# Patient Record
Sex: Female | Born: 1955 | Race: White | Hispanic: No | Marital: Married | Smoking: Never smoker
Health system: Southern US, Community
[De-identification: ages and names within clinical notes are randomized; demographics above are authoritative.]

## PROBLEM LIST (undated history)

## (undated) DIAGNOSIS — N6310 Unspecified lump in the right breast, unspecified quadrant: Secondary | ICD-10-CM

## (undated) DIAGNOSIS — R5383 Other fatigue: Secondary | ICD-10-CM

## (undated) DIAGNOSIS — L0591 Pilonidal cyst without abscess: Secondary | ICD-10-CM

## (undated) DIAGNOSIS — E7800 Pure hypercholesterolemia, unspecified: Secondary | ICD-10-CM

## (undated) DIAGNOSIS — N879 Dysplasia of cervix uteri, unspecified: Secondary | ICD-10-CM

## (undated) DIAGNOSIS — M85859 Other specified disorders of bone density and structure, unspecified thigh: Secondary | ICD-10-CM

## (undated) DIAGNOSIS — E559 Vitamin D deficiency, unspecified: Secondary | ICD-10-CM

## (undated) DIAGNOSIS — H11151 Pinguecula, right eye: Secondary | ICD-10-CM

## (undated) DIAGNOSIS — Z87442 Personal history of urinary calculi: Secondary | ICD-10-CM

## (undated) DIAGNOSIS — S62109A Fracture of unspecified carpal bone, unspecified wrist, initial encounter for closed fracture: Secondary | ICD-10-CM

## (undated) DIAGNOSIS — M1990 Unspecified osteoarthritis, unspecified site: Secondary | ICD-10-CM

## (undated) DIAGNOSIS — N200 Calculus of kidney: Secondary | ICD-10-CM

## (undated) HISTORY — PX: BREAST SURGERY: SHX581

## (undated) HISTORY — DX: Dysplasia of cervix uteri, unspecified: N87.9

## (undated) HISTORY — PX: FRACTURE SURGERY: SHX138

## (undated) HISTORY — DX: Pinguecula, right eye: H11.151

## (undated) HISTORY — DX: Calculus of kidney: N20.0

## (undated) HISTORY — PX: COSMETIC SURGERY: SHX468

## (undated) HISTORY — DX: Unspecified osteoarthritis, unspecified site: M19.90

## (undated) HISTORY — PX: EYE SURGERY: SHX253

---
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
## 1977-05-24 HISTORY — PX: OTHER SURGICAL HISTORY: SHX169

## 1978-05-24 DIAGNOSIS — N879 Dysplasia of cervix uteri, unspecified: Secondary | ICD-10-CM

## 1978-05-24 HISTORY — DX: Dysplasia of cervix uteri, unspecified: N87.9

## 1985-05-24 DIAGNOSIS — N200 Calculus of kidney: Secondary | ICD-10-CM

## 1985-05-24 HISTORY — DX: Calculus of kidney: N20.0

## 1988-05-24 HISTORY — PX: TUBAL LIGATION: SHX77

## 2004-05-24 HISTORY — PX: FOOT SURGERY: SHX648

## 2004-09-15 ENCOUNTER — Ambulatory Visit: Payer: Self-pay | Admitting: Unknown Physician Specialty

## 2005-12-23 ENCOUNTER — Ambulatory Visit: Payer: Self-pay | Admitting: Unknown Physician Specialty

## 2006-01-13 ENCOUNTER — Ambulatory Visit: Payer: Self-pay | Admitting: Gastroenterology

## 2006-12-23 LAB — HM COLONOSCOPY: HM Colonoscopy: NORMAL

## 2006-12-29 ENCOUNTER — Ambulatory Visit: Payer: Self-pay | Admitting: Unknown Physician Specialty

## 2008-01-01 ENCOUNTER — Ambulatory Visit: Payer: Self-pay | Admitting: Unknown Physician Specialty

## 2009-02-03 ENCOUNTER — Ambulatory Visit: Payer: Self-pay | Admitting: Unknown Physician Specialty

## 2010-05-24 HISTORY — PX: BREAST SURGERY: SHX581

## 2010-05-24 HISTORY — PX: BREAST BIOPSY: SHX20

## 2010-06-01 ENCOUNTER — Ambulatory Visit: Payer: Self-pay | Admitting: Unknown Physician Specialty

## 2010-06-19 ENCOUNTER — Ambulatory Visit: Payer: Self-pay | Admitting: Unknown Physician Specialty

## 2010-06-29 ENCOUNTER — Ambulatory Visit: Payer: Self-pay | Admitting: Unknown Physician Specialty

## 2010-07-07 ENCOUNTER — Ambulatory Visit: Payer: Self-pay | Admitting: Surgery

## 2010-11-26 LAB — HM MAMMOGRAPHY: HM Mammogram: NORMAL

## 2010-12-16 ENCOUNTER — Ambulatory Visit: Payer: Self-pay | Admitting: Surgery

## 2011-07-23 ENCOUNTER — Ambulatory Visit (INDEPENDENT_AMBULATORY_CARE_PROVIDER_SITE_OTHER): Payer: BC Managed Care – PPO | Admitting: Internal Medicine

## 2011-07-23 ENCOUNTER — Encounter: Payer: Self-pay | Admitting: Internal Medicine

## 2011-07-23 VITALS — BP 96/56 | HR 84 | Temp 98.0°F | Resp 16 | Ht 60.75 in | Wt 102.8 lb

## 2011-07-23 DIAGNOSIS — H11151 Pinguecula, right eye: Secondary | ICD-10-CM | POA: Insufficient documentation

## 2011-07-23 DIAGNOSIS — B351 Tinea unguium: Secondary | ICD-10-CM

## 2011-07-23 DIAGNOSIS — N200 Calculus of kidney: Secondary | ICD-10-CM | POA: Insufficient documentation

## 2011-07-23 NOTE — Patient Instructions (Addendum)
Returni June for yor annual physical

## 2011-07-23 NOTE — Progress Notes (Signed)
  Subjective:    Patient ID: Megan Mcintosh, female    DOB: 10-13-1955, 56 y.o.   MRN: [id number]  HPI  Ms. Bordonaro is a very healthy perimenopausal woman who is transferring care from Dr. Francia Greaves  Cc thickened discolored toe nails. Wears tennis shoes a lot, very athletic,  No pedicures except in summer to cover her discolored nails.      Review of Systems  Constitutional: Negative for fever, chills, appetite change, fatigue and unexpected weight change.  HENT: Negative for ear pain, congestion, sore throat, trouble swallowing, neck pain, voice change and sinus pressure.   Eyes: Negative for visual disturbance.  Respiratory: Negative for cough, shortness of breath, wheezing and stridor.   Cardiovascular: Negative for chest pain, palpitations and leg swelling.  Gastrointestinal: Negative for nausea, vomiting, abdominal pain, diarrhea, constipation, blood in stool, abdominal distention and anal bleeding.  Genitourinary: Negative for dysuria, flank pain and pelvic pain.  Musculoskeletal: Negative for myalgias, arthralgias and gait problem.  Skin: Negative for color change and rash.  Neurological: Positive for facial asymmetry. Negative for dizziness and headaches.  Hematological: Negative for adenopathy. Does not bruise/bleed easily.  Psychiatric/Behavioral: Negative for suicidal ideas, sleep disturbance and dysphoric mood. The patient is not nervous/anxious.        Objective:   Physical Exam  Constitutional: She is oriented to person, place, and time. She appears well-developed and well-nourished.  HENT:  Mouth/Throat: Oropharynx is clear and moist.  Eyes: EOM are normal. Pupils are equal, round, and reactive to light. No scleral icterus.  Neck: Normal range of motion. Neck supple. No JVD present. No thyromegaly present.  Cardiovascular: Normal rate, regular rhythm, normal heart sounds and intact distal pulses.   Pulmonary/Chest: Effort normal and breath sounds normal.  Abdominal: Soft.  Bowel sounds are normal. She exhibits no mass. There is no tenderness.  Musculoskeletal: Normal range of motion. She exhibits no edema.  Lymphadenopathy:    She has no cervical adenopathy.  Neurological: She is alert and oriented to person, place, and time.  Skin: Skin is warm and dry.     Psychiatric: She has a normal mood and affect.          Assessment & Plan:

## 2011-07-25 ENCOUNTER — Encounter: Payer: Self-pay | Admitting: Internal Medicine

## 2011-07-25 DIAGNOSIS — B351 Tinea unguium: Secondary | ICD-10-CM | POA: Insufficient documentation

## 2011-07-25 NOTE — Assessment & Plan Note (Signed)
Recommended nonpharmacologic methods of treating.

## 2011-11-24 ENCOUNTER — Encounter: Payer: Self-pay | Admitting: Internal Medicine

## 2011-11-24 ENCOUNTER — Ambulatory Visit (INDEPENDENT_AMBULATORY_CARE_PROVIDER_SITE_OTHER): Payer: BC Managed Care – PPO | Admitting: Internal Medicine

## 2011-11-24 VITALS — BP 104/62 | HR 60 | Temp 97.8°F | Resp 16 | Ht 60.75 in | Wt 93.5 lb

## 2011-11-24 DIAGNOSIS — R634 Abnormal weight loss: Secondary | ICD-10-CM

## 2011-11-24 DIAGNOSIS — Z13220 Encounter for screening for lipoid disorders: Secondary | ICD-10-CM

## 2011-11-24 DIAGNOSIS — N879 Dysplasia of cervix uteri, unspecified: Secondary | ICD-10-CM

## 2011-11-24 DIAGNOSIS — D249 Benign neoplasm of unspecified breast: Secondary | ICD-10-CM

## 2011-11-24 DIAGNOSIS — Z0000 Encounter for general adult medical examination without abnormal findings: Secondary | ICD-10-CM

## 2011-11-24 DIAGNOSIS — J069 Acute upper respiratory infection, unspecified: Secondary | ICD-10-CM

## 2011-11-24 DIAGNOSIS — Z1239 Encounter for other screening for malignant neoplasm of breast: Secondary | ICD-10-CM

## 2011-11-24 NOTE — Patient Instructions (Addendum)
You have a viral  Syndrome .  The post nasal drip is causing your sore throat.  Lavage your sinuses twice daily with Simply saline nasal spray.  Use benadryl 25 mg every 8 hours and Sudafed every 12 hours to manage the drainage and congestion.  Gargle with salt water if needed for raspy throat.    if you develop T > 100.4,  Green nasal discharge,  Or facial pain,  I will call you in a second round of antibiotic.

## 2011-11-24 NOTE — Progress Notes (Addendum)
Patient ID: Megan Mcintosh, female   DOB: 11-29-1955, 56 y.o.   MRN: [id number]  Subjective:     Megan Mcintosh is a 56 y.o. female and is here for a comprehensive physical exam. The patient reports Symptoms of URI lasting one week.  Started with sore throat one week ago, which hhas resolved, but she continues to have nasal congestion.  She took a course of azithromycin that a family fried called in for her while she was at the beach and she also took sudafed 12 hour and Afrin nasal spray but continues to have congestion and rhinitis..  Coughing a little but not a major problem.  No fevers, facial pain.  Some ear pressure which has resolved.    History   Social History  . Marital Status: Married    Spouse Name: N/A    Number of Children: N/A  . Years of Education: N/A   Occupational History  . Not on file.   Social History Main Topics  . Smoking status: Never Smoker   . Smokeless tobacco: Never Used  . Alcohol Use: Yes  . Drug Use: No  . Sexually Active: Not on file   Other Topics Concern  . Not on file   Social History Narrative  . No narrative on file   Health Maintenance  Topic Date Due  . Tetanus/tdap  01/07/1975  . Mammogram  01/06/2006  . Influenza Vaccine  02/22/2012  . Pap Smear  10/22/2013  . Colonoscopy  12/22/2016    The following portions of the patient's history were reviewed and updated as appropriate: allergies, current medications, past family history, past medical history, past social history, past surgical history and problem list.  Review of Systems A comprehensive review of systems was negative except for: Ears, nose, mouth, throat, and face: positive for nasal congestion   Objective:    BP 104/62  Pulse 60  Temp 97.8 F (36.6 C) (Oral)  Resp 16  Ht 5' 0.75" (1.543 m)  Wt 93 lb 8 oz (42.411 kg)  BMI 17.81 kg/m2  SpO2 99% General appearance: alert, cooperative and appears stated age Head: Normocephalic, without obvious abnormality, atraumatic Eyes:  conjunctivae/corneas clear. PERRL, EOM's intact. Fundi benign. Ears: abnormal TM right ear - diminished mobility, amber in color and serous middle ear fluid Nose: Nares normal. Septum midline. Mucosa normal. No drainage or sinus tenderness. Throat: lips, mucosa, and tongue normal; teeth and gums normal Neck: no adenopathy, no carotid bruit, no JVD, supple, symmetrical, trachea midline and thyroid not enlarged, symmetric, no tenderness/mass/nodules Back: symmetric, no curvature. ROM normal. No CVA tenderness. Lungs: clear to auscultation bilaterally Breasts: normal appearance, no masses or tenderness Heart: regular rate and rhythm, S1, S2 normal, no murmur, click, rub or gallop Abdomen: soft, non-tender; bowel sounds normal; no masses,  no organomegaly Extremities: extremities normal, atraumatic, no cyanosis or edema Skin: Skin color, texture, turgor normal. No rashes or lesions    Assessment:    Healthy female exam.  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  She will resume annual sceeening July 2013.  URI (upper respiratory infection) This URI is most likely viral given the failure of mild HEENT  symptoms to resolve after a z pack.  I have explained that in viral URIS, an antibiotic will not help the symptoms and will increase the risk of developing diarrhea.,  Continue oral and nasal decongestants,  Ibuprofen 400 mg and tylenol 650 mq 8 hrs for aches and pains,  And  will addtessalon 100 mg every 8 hours prn cough  And seconda round of abx only if symptoms worsen to include fevers, facial pain, purulent sputum./drainage.    Cervical dysplasia Her annual PAPS are done by [facility] due to history of dysplasia   Updated Medication List Outpatient Encounter Prescriptions as of 11/24/2011  Medication Sig Dispense Refill  . Calcium Carbonate-Vitamin D (CALCIUM + D) 600-200 MG-UNIT TABS Take by mouth.        Plan:     See After Visit Summary for Counseling  Recommendations

## 2011-11-26 DIAGNOSIS — D249 Benign neoplasm of unspecified breast: Secondary | ICD-10-CM | POA: Insufficient documentation

## 2011-11-26 DIAGNOSIS — J069 Acute upper respiratory infection, unspecified: Secondary | ICD-10-CM | POA: Insufficient documentation

## 2011-11-26 NOTE — Assessment & Plan Note (Signed)
S/p biopsy of right breast spiculated nodule Feb 2012.  She will resume annual sceeening July 2013.

## 2011-11-26 NOTE — Assessment & Plan Note (Signed)
Her annual PAPS are done by [facility] due to history of dysplasia

## 2011-11-26 NOTE — Assessment & Plan Note (Signed)
This URI is most likely viral given the failure of mild HEENT  symptoms to resolve after a z pack.  I have explained that in viral URIS, an antibiotic will not help the symptoms and will increase the risk of developing diarrhea.,  Continue oral and nasal decongestants,  Ibuprofen 400 mg and tylenol 650 mq 8 hrs for aches and pains,  And will addtessalon 100 mg every 8 hours prn cough  And seconda round of abx only if symptoms worsen to include fevers, facial pain, purulent sputum./drainage.  

## 2011-12-15 ENCOUNTER — Other Ambulatory Visit (INDEPENDENT_AMBULATORY_CARE_PROVIDER_SITE_OTHER): Payer: BC Managed Care – PPO | Admitting: *Deleted

## 2011-12-15 ENCOUNTER — Other Ambulatory Visit: Payer: BC Managed Care – PPO

## 2011-12-15 DIAGNOSIS — R634 Abnormal weight loss: Secondary | ICD-10-CM

## 2011-12-15 DIAGNOSIS — Z13220 Encounter for screening for lipoid disorders: Secondary | ICD-10-CM

## 2011-12-15 LAB — LIPID PANEL
HDL: 118.9 mg/dL (ref >39.00)
VLDL: 10.6 mg/dL (ref 0.0–40.0)

## 2011-12-15 LAB — CBC WITH DIFFERENTIAL/PLATELET
Basophils Relative: 0.4 % (ref 0.0–3.0)
Eosinophils Relative: 0.4 % (ref 0.0–5.0)
Hemoglobin: 13.6 g/dL (ref 12.0–15.0)
Lymphocytes Relative: 21.5 % (ref 12.0–46.0)
MCHC: 33 g/dL (ref 30.0–36.0)
Neutro Abs: 3 10*3/uL (ref 1.4–7.7)
Neutrophils Relative %: 68.6 % (ref 43.0–77.0)
RBC: 4.55 Mil/uL (ref 3.87–5.11)
WBC: 4.4 10*3/uL — ABNORMAL LOW (ref 4.5–10.5)

## 2011-12-16 LAB — COMPLETE METABOLIC PANEL WITH GFR
ALT: 11 U/L (ref 0–35)
AST: 22 U/L (ref 0–37)
Alkaline Phosphatase: 52 U/L (ref 39–117)
CO2: 24 mEq/L (ref 19–32)
GFR, Est African American: >89 mL/min
Sodium: 137 mEq/L (ref 135–145)
Total Bilirubin: 0.9 mg/dL (ref 0.3–1.2)
Total Protein: 6.3 g/dL (ref 6.0–8.3)

## 2011-12-28 ENCOUNTER — Ambulatory Visit: Payer: Self-pay | Admitting: Internal Medicine

## 2012-01-11 ENCOUNTER — Encounter: Payer: Self-pay | Admitting: Internal Medicine

## 2012-07-08 ENCOUNTER — Other Ambulatory Visit: Payer: Self-pay

## 2012-11-26 ENCOUNTER — Encounter: Payer: Self-pay | Admitting: Internal Medicine

## 2012-11-27 ENCOUNTER — Ambulatory Visit (INDEPENDENT_AMBULATORY_CARE_PROVIDER_SITE_OTHER): Payer: BC Managed Care – PPO | Admitting: Internal Medicine

## 2012-11-27 ENCOUNTER — Ambulatory Visit: Payer: Self-pay | Admitting: Internal Medicine

## 2012-11-27 ENCOUNTER — Telehealth: Payer: Self-pay | Admitting: Internal Medicine

## 2012-11-27 ENCOUNTER — Encounter: Payer: Self-pay | Admitting: Internal Medicine

## 2012-11-27 VITALS — BP 100/76 | HR 62 | Temp 98.1°F | Wt 96.0 lb

## 2012-11-27 DIAGNOSIS — R109 Unspecified abdominal pain: Secondary | ICD-10-CM | POA: Insufficient documentation

## 2012-11-27 LAB — CBC WITH DIFFERENTIAL/PLATELET
Eosinophils Relative: 0.9 % (ref 0.0–5.0)
Lymphocytes Relative: 21.8 % (ref 12.0–46.0)
MCV: 90.4 fl (ref 78.0–100.0)
Monocytes Absolute: 0.7 10*3/uL (ref 0.1–1.0)
Monocytes Relative: 13.9 % — ABNORMAL HIGH (ref 3.0–12.0)
Neutrophils Relative %: 63.1 % (ref 43.0–77.0)
Platelets: 331 10*3/uL (ref 150.0–400.0)
RBC: 4.69 Mil/uL (ref 3.87–5.11)
WBC: 4.8 10*3/uL (ref 4.5–10.5)

## 2012-11-27 LAB — POCT URINALYSIS DIPSTICK
Glucose, UA: NEGATIVE
Leukocytes, UA: NEGATIVE
Urobilinogen, UA: 0.2

## 2012-11-27 LAB — COMPREHENSIVE METABOLIC PANEL
Albumin: 4 g/dL (ref 3.5–5.2)
BUN: 15 mg/dL (ref 6–23)
CO2: 25 mEq/L (ref 19–32)
Calcium: 9.1 mg/dL (ref 8.4–10.5)
Chloride: 105 mEq/L (ref 96–112)
GFR: 94.83 mL/min (ref >60.00)
Glucose, Bld: 94 mg/dL (ref 70–99)
Potassium: 4.2 mEq/L (ref 3.5–5.1)
Sodium: 138 mEq/L (ref 135–145)
Total Protein: 6.8 g/dL (ref 6.0–8.3)

## 2012-11-27 NOTE — Progress Notes (Signed)
  Subjective:    Patient ID: Megan Mcintosh, female    DOB: 01/19/1956, 57 y.o.   MRN: [id number]  HPI 57YO female presents for acute visit with 4 days h/o mid abdominal pain. Described as sharp, intermittent. Occurs several times during the day, without clear trigger. Improves without any intervention after several hours. Pt able to eat without change in symptoms. No nausea, vomiting, diarrhea. Having regular BM. Had some chills and fever on Friday and Saturday. Not taking any new medications, supplements. No known sick contacts. Had c-section in the past x2. No other abdominal surgery.  Outpatient Encounter Prescriptions as of 11/27/2012  Medication Sig Dispense Refill  . Calcium Carbonate-Vitamin D (CALCIUM + D) 600-200 MG-UNIT TABS Take by mouth.       No facility-administered encounter medications on file as of 11/27/2012.   BP 100/76  Pulse 62  Temp(Src) 98.1 F (36.7 C) (Oral)  Wt 96 lb (43.545 kg)  BMI 18.29 kg/m2  SpO2 98%  Review of Systems  Constitutional: Positive for fever. Negative for chills, appetite change, fatigue and unexpected weight change.  HENT: Negative for ear pain, congestion, sore throat, trouble swallowing, neck pain, voice change and sinus pressure.   Eyes: Negative for visual disturbance.  Respiratory: Negative for cough, shortness of breath, wheezing and stridor.   Cardiovascular: Negative for chest pain, palpitations and leg swelling.  Gastrointestinal: Positive for abdominal pain and abdominal distention. Negative for nausea, vomiting, diarrhea, constipation, blood in stool and anal bleeding.  Genitourinary: Negative for dysuria and flank pain.  Musculoskeletal: Negative for myalgias, arthralgias and gait problem.  Skin: Negative for color change and rash.  Neurological: Negative for dizziness and headaches.  Hematological: Negative for adenopathy. Does not bruise/bleed easily.  Psychiatric/Behavioral: Negative for suicidal ideas, sleep disturbance and  dysphoric mood. The patient is not nervous/anxious.        Objective:   Physical Exam  Constitutional: She is oriented to person, place, and time. She appears well-developed and well-nourished. No distress.  HENT:  Head: Normocephalic and atraumatic.  Right Ear: External ear normal.  Left Ear: External ear normal.  Nose: Nose normal.  Mouth/Throat: Oropharynx is clear and moist. No oropharyngeal exudate.  Eyes: Conjunctivae are normal. Pupils are equal, round, and reactive to light. Right eye exhibits no discharge. Left eye exhibits no discharge. No scleral icterus.  Neck: Normal range of motion. Neck supple. No tracheal deviation present. No thyromegaly present.  Cardiovascular: Normal rate, regular rhythm, normal heart sounds and intact distal pulses.  Exam reveals no gallop and no friction rub.   No murmur heard. Pulmonary/Chest: Effort normal and breath sounds normal. No accessory muscle usage. Not tachypneic. No respiratory distress. She has no decreased breath sounds. She has no wheezes. She has no rhonchi. She has no rales. She exhibits no tenderness.  Abdominal: Soft. Bowel sounds are normal. She exhibits distension. She exhibits no mass. There is tenderness. There is guarding (mild diffuse).  Musculoskeletal: Normal range of motion. She exhibits no edema and no tenderness.  Lymphadenopathy:    She has no cervical adenopathy.  Neurological: She is alert and oriented to person, place, and time. No cranial nerve deficit. She exhibits normal muscle tone. Coordination normal.  Skin: Skin is warm and dry. No rash noted. She is not diaphoretic. No erythema. No pallor.  Psychiatric: She has a normal mood and affect. Her behavior is normal. Judgment and thought content normal.          Assessment & Plan:

## 2012-11-27 NOTE — Addendum Note (Signed)
Addended by: Montine Circle D on: 11/27/2012 11:19 AM   Modules accepted: Orders

## 2012-11-27 NOTE — Assessment & Plan Note (Signed)
Periumbilical abdominal pain, fever. No nausea or vomiting or diarrhea to suggest viral illness. Mild distension and guarding noted on exam. Will set up CT abdomen and pelvis to evaluate acute appendicitis. Will send UA, CBC, CMP.

## 2012-11-27 NOTE — Telephone Encounter (Signed)
° °  Patient Information:  Caller Name: Susie  Phone: [phone]  Patient: Megan Mcintosh, Megan Mcintosh  Gender: Female  DOB: 12-02-1955  Age: 57 Years  PCP: Duncan Dull (Adults only)  Office Follow Up:  Does the office need to follow up with this patient?: No  Instructions For The Office: N/A   Symptoms  Reason For Call & Symptoms: Pt started with abdominal pain on Friday 11/24/12. Pt had fever over the w/e 100 (orally)  Reviewed Health History In EMR: Yes  Reviewed Medications In EMR: Yes  Reviewed Allergies In EMR: Yes  Reviewed Surgeries / Procedures: Yes  Date of Onset of Symptoms: 11/24/2012  Guideline(s) Used:  Abdominal Pain - Female  Disposition Per Guideline:   See Today in Office  Reason For Disposition Reached:   Moderate or mild pain that comes and goes (cramps) lasts > 24 hours  Advice Given:  N/A  Patient Will Follow Care Advice:  YES  Appointment Scheduled:  11/27/2012 09:30:00 Appointment Scheduled Provider:  Ronna Polio (Adults only)

## 2012-11-28 ENCOUNTER — Encounter: Payer: Self-pay | Admitting: Internal Medicine

## 2012-11-28 ENCOUNTER — Telehealth: Payer: Self-pay | Admitting: Internal Medicine

## 2012-11-28 NOTE — Telephone Encounter (Signed)
Follow up.

## 2012-11-29 LAB — URINE CULTURE: Colony Count: 10000

## 2012-12-06 ENCOUNTER — Encounter: Payer: Self-pay | Admitting: Internal Medicine

## 2012-12-12 ENCOUNTER — Other Ambulatory Visit (HOSPITAL_COMMUNITY)
Admission: RE | Admit: 2012-12-12 | Discharge: 2012-12-12 | Disposition: A | Discharge status: Home / Self Care | Payer: BC Managed Care – PPO | Source: Ambulatory Visit | Attending: Internal Medicine | Admitting: Internal Medicine

## 2012-12-12 ENCOUNTER — Encounter: Payer: Self-pay | Admitting: Internal Medicine

## 2012-12-12 ENCOUNTER — Ambulatory Visit (INDEPENDENT_AMBULATORY_CARE_PROVIDER_SITE_OTHER): Payer: BC Managed Care – PPO | Admitting: Internal Medicine

## 2012-12-12 VITALS — BP 100/68 | HR 59 | Temp 97.8°F | Resp 14 | Ht 61.0 in | Wt 95.8 lb

## 2012-12-12 DIAGNOSIS — D249 Benign neoplasm of unspecified breast: Secondary | ICD-10-CM

## 2012-12-12 DIAGNOSIS — Z1151 Encounter for screening for human papillomavirus (HPV): Secondary | ICD-10-CM | POA: Insufficient documentation

## 2012-12-12 DIAGNOSIS — Z0000 Encounter for general adult medical examination without abnormal findings: Secondary | ICD-10-CM | POA: Insufficient documentation

## 2012-12-12 DIAGNOSIS — Z1239 Encounter for other screening for malignant neoplasm of breast: Secondary | ICD-10-CM

## 2012-12-12 DIAGNOSIS — D72819 Decreased white blood cell count, unspecified: Secondary | ICD-10-CM

## 2012-12-12 DIAGNOSIS — Z01419 Encounter for gynecological examination (general) (routine) without abnormal findings: Secondary | ICD-10-CM | POA: Insufficient documentation

## 2012-12-12 DIAGNOSIS — Z23 Encounter for immunization: Secondary | ICD-10-CM

## 2012-12-12 DIAGNOSIS — N879 Dysplasia of cervix uteri, unspecified: Secondary | ICD-10-CM

## 2012-12-12 NOTE — Assessment & Plan Note (Addendum)
PAP smear and HPV screen was  done today due to history of cervical dysplasia.

## 2012-12-12 NOTE — Patient Instructions (Addendum)
You had your annual  wellness exam today, including a PAP smear   We will schedule your mammogram soon.  You received the TDaP vaccine today  We will repeat your DEXA scan 5 years from your prior bone density test

## 2012-12-14 ENCOUNTER — Encounter: Payer: Self-pay | Admitting: Internal Medicine

## 2012-12-14 NOTE — Progress Notes (Signed)
Patient ID: Megan Mcintosh, female   DOB: 10-Aug-1955, 57 y.o.   MRN: [id number]  Subjective:     Megan Mcintosh is a 57 y.o. female and is here for a comprehensive physical exam. The patient reports no problems.  History   Social History  . Marital Status: Married    Spouse Name: N/A    Number of Children: N/A  . Years of Education: N/A   Occupational History  . Not on file.   Social History Main Topics  . Smoking status: Never Smoker   . Smokeless tobacco: Never Used  . Alcohol Use: Yes  . Drug Use: No  . Sexually Active: Not on file   Other Topics Concern  . Not on file   Social History Narrative  . No narrative on file   Health Maintenance  Topic Date Due  . Influenza Vaccine  01/22/2013  . Pap Smear  10/22/2013  . Mammogram  12/27/2013  . Colonoscopy  12/22/2016  . Tetanus/tdap  12/13/2022    The following portions of the patient's history were reviewed and updated as appropriate: allergies, current medications, past family history, past medical history, past social history, past surgical history and problem list.  Review of Systems A comprehensive review of systems was negative.   Objective:   BP 100/68  Pulse 59  Temp(Src) 97.8 F (36.6 C) (Oral)  Resp 14  Ht 5\' 1"  (1.549 m)  Wt 95 lb 12 oz (43.432 kg)  BMI 18.1 kg/m2  SpO2 99%  General Appearance:    Alert, cooperative, no distress, appears stated age  Head:    Normocephalic, without obvious abnormality, atraumatic  Eyes:    PERRL, conjunctiva/corneas clear, EOM's intact, fundi    benign, both eyes  Ears:    Normal TM's and external ear canals, both ears  Nose:   Nares normal, septum midline, mucosa normal, no drainage    or sinus tenderness  Throat:   Lips, mucosa, and tongue normal; teeth and gums normal  Neck:   Supple, symmetrical, trachea midline, no adenopathy;    thyroid:  no enlargement/tenderness/nodules; no carotid   bruit or JVD  Back:     Symmetric, no curvature, ROM normal, no CVA  tenderness  Lungs:     Clear to auscultation bilaterally, respirations unlabored  Chest Nydam:    No tenderness or deformity   Heart:    Regular rate and rhythm, S1 and S2 normal, no murmur, rub   or gallop  Breast Exam:    No tenderness, masses, or nipple abnormality  Abdomen:     Soft, non-tender, bowel sounds active all four quadrants,    no masses, no organomegaly  Genitalia:    Pelvic: cervix normal in appearance, external genitalia normal, no adnexal masses or tenderness, no cervical motion tenderness, rectovaginal septum normal, uterus normal size, shape, and consistency and vagina normal without discharge  Extremities:   Extremities normal, atraumatic, no cyanosis or edema  Pulses:   2+ and symmetric all extremities  Skin:   Skin color, texture, turgor normal, no rashes or lesions  Lymph nodes:   Cervical, supraclavicular, and axillary nodes normal  Neurologic:   CNII-XII intact, normal strength, sensation and reflexes    throughout     Assessment:   Cervical dysplasia PAP smear and HPV screen was  done today due to history of cervical dysplasia.  Fibroadenoma of breast Annual mammograms are up-to-date.   Updated Medication List Outpatient Encounter Prescriptions as of 12/12/2012  Medication Sig Dispense Refill  .  Calcium Carbonate-Vitamin D (CALCIUM + D) 600-200 MG-UNIT TABS Take by mouth.       No facility-administered encounter medications on file as of 12/12/2012.

## 2012-12-14 NOTE — Assessment & Plan Note (Signed)
Annual mammograms are up-to-date.

## 2012-12-19 ENCOUNTER — Encounter: Payer: Self-pay | Admitting: Internal Medicine

## 2012-12-19 LAB — HM PAP SMEAR: HM Pap smear: NORMAL

## 2012-12-22 ENCOUNTER — Encounter: Payer: Self-pay | Admitting: *Deleted

## 2012-12-22 NOTE — Telephone Encounter (Signed)
Mailed unread message to patient.  

## 2013-01-04 ENCOUNTER — Ambulatory Visit: Payer: Self-pay | Admitting: Internal Medicine

## 2013-01-04 LAB — HM MAMMOGRAPHY: HM Mammogram: NORMAL

## 2013-01-17 ENCOUNTER — Encounter: Payer: Self-pay | Admitting: Internal Medicine

## 2013-03-29 ENCOUNTER — Other Ambulatory Visit: Payer: Self-pay

## 2013-12-19 ENCOUNTER — Encounter: Payer: Self-pay | Admitting: Internal Medicine

## 2013-12-19 ENCOUNTER — Ambulatory Visit (INDEPENDENT_AMBULATORY_CARE_PROVIDER_SITE_OTHER): Payer: BC Managed Care – PPO | Admitting: Internal Medicine

## 2013-12-19 VITALS — BP 98/70 | HR 63 | Temp 97.6°F | Resp 16 | Ht 61.0 in | Wt 96.2 lb

## 2013-12-19 DIAGNOSIS — R5381 Other malaise: Secondary | ICD-10-CM

## 2013-12-19 DIAGNOSIS — Z0000 Encounter for general adult medical examination without abnormal findings: Secondary | ICD-10-CM

## 2013-12-19 DIAGNOSIS — R5383 Other fatigue: Secondary | ICD-10-CM

## 2013-12-19 DIAGNOSIS — R636 Underweight: Secondary | ICD-10-CM

## 2013-12-19 DIAGNOSIS — M949 Disorder of cartilage, unspecified: Secondary | ICD-10-CM

## 2013-12-19 DIAGNOSIS — M8580 Other specified disorders of bone density and structure, unspecified site: Secondary | ICD-10-CM

## 2013-12-19 DIAGNOSIS — N879 Dysplasia of cervix uteri, unspecified: Secondary | ICD-10-CM

## 2013-12-19 DIAGNOSIS — Z1239 Encounter for other screening for malignant neoplasm of breast: Secondary | ICD-10-CM

## 2013-12-19 DIAGNOSIS — M899 Disorder of bone, unspecified: Secondary | ICD-10-CM

## 2013-12-19 DIAGNOSIS — E785 Hyperlipidemia, unspecified: Secondary | ICD-10-CM

## 2013-12-19 NOTE — Progress Notes (Signed)
Patient ID: Megan Mcintosh, female   DOB: 12-Apr-1956, 58 y.o.   MRN: [id number]   Subjective:     Megan Mcintosh is a 58 y.o. female and is here for a comprehensive physical exam. The patient reports no problems.  History   Social History  . Marital Status: Married    Spouse Name: N/A    Number of Children: N/A  . Years of Education: N/A   Occupational History  . Not on file.   Social History Main Topics  . Smoking status: Never Smoker   . Smokeless tobacco: Never Used  . Alcohol Use: Yes  . Drug Use: No  . Sexual Activity: Not on file   Other Topics Concern  . Not on file   Social History Narrative  . No narrative on file   Health Maintenance  Topic Date Due  . Influenza Vaccine  12/22/2013  . Mammogram  01/05/2015  . Pap Smear  12/20/2015  . Colonoscopy  12/22/2016  . Tetanus/tdap  12/13/2022    The following portions of the patient's history were reviewed and updated as appropriate: allergies, current medications, past family history, past medical history, past social history, past surgical history and problem list.  Review of Systems A comprehensive review of systems was negative.   Objective:   BP 98/70  Pulse 63  Temp(Src) 97.6 F (36.4 C) (Oral)  Resp 16  Ht 5\' 1"  (1.549 m)  Wt 96 lb 4 oz (43.659 kg)  BMI 18.20 kg/m2  SpO2 96%  General appearance: alert, cooperative and appears stated age Head: Normocephalic, without obvious abnormality, atraumatic Eyes: conjunctivae/corneas clear. PERRL, EOM's intact. Fundi benign. Ears: normal TM's and external ear canals both ears Nose: Nares normal. Septum midline. Mucosa normal. No drainage or sinus tenderness. Throat: lips, mucosa, and tongue normal; teeth and gums normal Neck: no adenopathy, no carotid bruit, no JVD, supple, symmetrical, trachea midline and thyroid not enlarged, symmetric, no tenderness/mass/nodules Lungs: clear to auscultation bilaterally Breasts: normal appearance, no masses or  tenderness Heart: regular rate and rhythm, S1, S2 normal, no murmur, click, rub or gallop Abdomen: soft, non-tender; bowel sounds normal; no masses,  no organomegaly Extremities: extremities normal, atraumatic, no cyanosis or edema Pulses: 2+ and symmetric Skin: Skin color, texture, turgor normal. No rashes or lesions Neurologic: Alert and oriented X 3, normal strength and tone. Normal symmetric reflexes. Normal coordination and gait.   .    Assessment and Plan:    Cervical dysplasia PAP smear and HPV screens have been done annually x 3  since 2012 for history of cervical dysplasia.    Routine general medical examination at a health care facility Annual comprehensive exam was done including breast, excluding pelvic and PAP smear. All screenings have been addressed .    Updated Medication List Outpatient Encounter Prescriptions as of 12/19/2013  Medication Sig  . Calcium Carbonate-Vitamin D (CALCIUM + D) 600-200 MG-UNIT TABS Take by mouth.

## 2013-12-19 NOTE — Assessment & Plan Note (Addendum)
PAP smear and HPV screens have been done annually x 3  since 2012 for history of cervical dysplasia.

## 2013-12-19 NOTE — Patient Instructions (Addendum)
You had your annual  wellness exam today.  We will repeat your PAP smear in 2016,  We will schedule your mammogram in August using the 3d modaltiy   Please make an appt for fasting labs at your earliest convenience.   We will contact you with the bloodwork results imcluing Vit d level

## 2013-12-19 NOTE — Progress Notes (Signed)
Pre-visit discussion using our clinic review tool. No additional management support is needed unless otherwise documented below in the visit note.  

## 2013-12-22 NOTE — Assessment & Plan Note (Signed)
Annual comprehensive exam was done including breast, excluding pelvic and PAP smear. All screenings have been addressed .  

## 2013-12-26 ENCOUNTER — Telehealth: Payer: Self-pay | Admitting: *Deleted

## 2013-12-26 ENCOUNTER — Other Ambulatory Visit (INDEPENDENT_AMBULATORY_CARE_PROVIDER_SITE_OTHER): Payer: BC Managed Care – PPO

## 2013-12-26 DIAGNOSIS — E785 Hyperlipidemia, unspecified: Secondary | ICD-10-CM

## 2013-12-26 DIAGNOSIS — R5381 Other malaise: Secondary | ICD-10-CM

## 2013-12-26 DIAGNOSIS — M8580 Other specified disorders of bone density and structure, unspecified site: Secondary | ICD-10-CM

## 2013-12-26 DIAGNOSIS — R5383 Other fatigue: Secondary | ICD-10-CM

## 2013-12-26 DIAGNOSIS — M899 Disorder of bone, unspecified: Secondary | ICD-10-CM

## 2013-12-26 DIAGNOSIS — M949 Disorder of cartilage, unspecified: Secondary | ICD-10-CM

## 2013-12-26 LAB — CBC WITH DIFFERENTIAL/PLATELET
BASOS PCT: 0.4 % (ref 0.0–3.0)
Basophils Absolute: 0 10*3/uL (ref 0.0–0.1)
Eosinophils Absolute: 0 10*3/uL (ref 0.0–0.7)
Eosinophils Relative: 0.8 % (ref 0.0–5.0)
HCT: 42.9 % (ref 36.0–46.0)
HEMOGLOBIN: 14.4 g/dL (ref 12.0–15.0)
Lymphocytes Relative: 37.5 % (ref 12.0–46.0)
Lymphs Abs: 1.5 10*3/uL (ref 0.7–4.0)
MCHC: 33.5 g/dL (ref 30.0–36.0)
MCV: 90.8 fl (ref 78.0–100.0)
MONO ABS: 0.3 10*3/uL (ref 0.1–1.0)
Monocytes Relative: 6.6 % (ref 3.0–12.0)
NEUTROS ABS: 2.2 10*3/uL (ref 1.4–7.7)
Neutrophils Relative %: 54.7 % (ref 43.0–77.0)
Platelets: 373 10*3/uL (ref 150.0–400.0)
RBC: 4.72 Mil/uL (ref 3.87–5.11)
RDW: 13.1 % (ref 11.5–15.5)
WBC: 4.1 10*3/uL (ref 4.0–10.5)

## 2013-12-26 LAB — LIPID PANEL
Cholesterol: 245 mg/dL — ABNORMAL HIGH (ref 0–200)
HDL: 120.8 mg/dL (ref >39.00)
LDL CALC: 116 mg/dL — AB (ref 0–99)
NonHDL: 124.2
Total CHOL/HDL Ratio: 2
Triglycerides: 43 mg/dL (ref 0.0–149.0)
VLDL: 8.6 mg/dL (ref 0.0–40.0)

## 2013-12-26 LAB — COMPREHENSIVE METABOLIC PANEL
ALK PHOS: 60 U/L (ref 39–117)
ALT: 13 U/L (ref 0–35)
AST: 23 U/L (ref 0–37)
Albumin: 4.4 g/dL (ref 3.5–5.2)
BUN: 26 mg/dL — ABNORMAL HIGH (ref 6–23)
CALCIUM: 9.6 mg/dL (ref 8.4–10.5)
CHLORIDE: 102 meq/L (ref 96–112)
CO2: 28 mEq/L (ref 19–32)
CREATININE: 0.8 mg/dL (ref 0.4–1.2)
GFR: 83.09 mL/min (ref >60.00)
Glucose, Bld: 82 mg/dL (ref 70–99)
Potassium: 4.2 mEq/L (ref 3.5–5.1)
Sodium: 138 mEq/L (ref 135–145)
Total Bilirubin: 1 mg/dL (ref 0.2–1.2)
Total Protein: 7.1 g/dL (ref 6.0–8.3)

## 2013-12-26 LAB — TSH: TSH: 1.94 u[IU]/mL (ref 0.35–4.50)

## 2013-12-26 LAB — VITAMIN D 25 HYDROXY (VIT D DEFICIENCY, FRACTURES): VITD: 44.53 ng/mL (ref 30.00–100.00)

## 2013-12-26 NOTE — Telephone Encounter (Signed)
Pt would like a call instead of a mychart message with the results

## 2013-12-26 NOTE — Telephone Encounter (Signed)
Please advise of results when return and I will call patient.

## 2014-02-14 ENCOUNTER — Ambulatory Visit: Payer: Self-pay | Admitting: Internal Medicine

## 2014-02-14 LAB — HM MAMMOGRAPHY: HM Mammogram: NEGATIVE

## 2014-02-18 ENCOUNTER — Encounter: Payer: Self-pay | Admitting: *Deleted

## 2014-09-03 DIAGNOSIS — N200 Calculus of kidney: Secondary | ICD-10-CM | POA: Insufficient documentation

## 2014-09-03 DIAGNOSIS — H11159 Pinguecula, unspecified eye: Secondary | ICD-10-CM | POA: Insufficient documentation

## 2015-01-15 ENCOUNTER — Ambulatory Visit (INDEPENDENT_AMBULATORY_CARE_PROVIDER_SITE_OTHER): Payer: BLUE CROSS/BLUE SHIELD | Admitting: Internal Medicine

## 2015-01-15 ENCOUNTER — Encounter: Payer: Self-pay | Admitting: Internal Medicine

## 2015-01-15 ENCOUNTER — Encounter (INDEPENDENT_AMBULATORY_CARE_PROVIDER_SITE_OTHER): Payer: Self-pay

## 2015-01-15 VITALS — BP 98/64 | HR 59 | Temp 98.1°F | Resp 12 | Ht 60.75 in | Wt 97.8 lb

## 2015-01-15 DIAGNOSIS — Z0000 Encounter for general adult medical examination without abnormal findings: Secondary | ICD-10-CM

## 2015-01-15 DIAGNOSIS — E559 Vitamin D deficiency, unspecified: Secondary | ICD-10-CM

## 2015-01-15 DIAGNOSIS — E785 Hyperlipidemia, unspecified: Secondary | ICD-10-CM

## 2015-01-15 DIAGNOSIS — Z1239 Encounter for other screening for malignant neoplasm of breast: Secondary | ICD-10-CM

## 2015-01-15 DIAGNOSIS — Z13820 Encounter for screening for osteoporosis: Secondary | ICD-10-CM

## 2015-01-15 DIAGNOSIS — R5383 Other fatigue: Secondary | ICD-10-CM | POA: Diagnosis not present

## 2015-01-15 DIAGNOSIS — Z113 Encounter for screening for infections with a predominantly sexual mode of transmission: Secondary | ICD-10-CM

## 2015-01-15 LAB — COMPREHENSIVE METABOLIC PANEL
ALT: 12 U/L (ref 0–35)
AST: 22 U/L (ref 0–37)
Albumin: 4.7 g/dL (ref 3.5–5.2)
Alkaline Phosphatase: 65 U/L (ref 39–117)
BILIRUBIN TOTAL: 0.8 mg/dL (ref 0.2–1.2)
BUN: 24 mg/dL — ABNORMAL HIGH (ref 6–23)
CALCIUM: 10 mg/dL (ref 8.4–10.5)
CHLORIDE: 101 meq/L (ref 96–112)
CO2: 29 meq/L (ref 19–32)
CREATININE: 0.7 mg/dL (ref 0.40–1.20)
GFR: 91.02 mL/min (ref >60.00)
GLUCOSE: 85 mg/dL (ref 70–99)
Potassium: 4.8 mEq/L (ref 3.5–5.1)
SODIUM: 138 meq/L (ref 135–145)
Total Protein: 7.2 g/dL (ref 6.0–8.3)

## 2015-01-15 NOTE — Progress Notes (Signed)
Pre-visit discussion using our clinic review tool. No additional management support is needed unless otherwise documented below in the visit note.  

## 2015-01-15 NOTE — Patient Instructions (Signed)

## 2015-01-16 LAB — LIPID PANEL
CHOL/HDL RATIO: 2
Cholesterol: 258 mg/dL — ABNORMAL HIGH (ref 0–200)
HDL: 125.2 mg/dL (ref >39.00)
LDL Cholesterol: 125 mg/dL — ABNORMAL HIGH (ref 0–99)
NonHDL: 133.18
TRIGLYCERIDES: 42 mg/dL (ref 0.0–149.0)
VLDL: 8.4 mg/dL (ref 0.0–40.0)

## 2015-01-16 LAB — CBC WITH DIFFERENTIAL/PLATELET
Basophils Absolute: 0 10*3/uL (ref 0.0–0.1)
Basophils Relative: 0.4 % (ref 0.0–3.0)
EOS PCT: 0.6 % (ref 0.0–5.0)
Eosinophils Absolute: 0 10*3/uL (ref 0.0–0.7)
HCT: 44 % (ref 36.0–46.0)
Hemoglobin: 14.6 g/dL (ref 12.0–15.0)
LYMPHS ABS: 1.4 10*3/uL (ref 0.7–4.0)
Lymphocytes Relative: 26.9 % (ref 12.0–46.0)
MCHC: 33.2 g/dL (ref 30.0–36.0)
MCV: 90.6 fl (ref 78.0–100.0)
MONO ABS: 0.1 10*3/uL (ref 0.1–1.0)
MONOS PCT: 1.5 % — AB (ref 3.0–12.0)
NEUTROS ABS: 3.7 10*3/uL (ref 1.4–7.7)
NEUTROS PCT: 70.6 % (ref 43.0–77.0)
PLATELETS: 387 10*3/uL (ref 150.0–400.0)
RBC: 4.86 Mil/uL (ref 3.87–5.11)
RDW: 13.3 % (ref 11.5–15.5)
WBC: 5.2 10*3/uL (ref 4.0–10.5)

## 2015-01-16 LAB — HIV ANTIBODY (ROUTINE TESTING W REFLEX): HIV 1&2 Ab, 4th Generation: NONREACTIVE

## 2015-01-16 LAB — TSH: TSH: 1.58 u[IU]/mL (ref 0.35–4.50)

## 2015-01-16 LAB — VITAMIN D 25 HYDROXY (VIT D DEFICIENCY, FRACTURES): VITD: 34.89 ng/mL (ref 30.00–100.00)

## 2015-01-16 LAB — HEPATITIS C ANTIBODY: HCV AB: NEGATIVE

## 2015-01-17 NOTE — Progress Notes (Signed)
Patient ID: Megan Mcintosh, female    DOB: July 10, 1955  Age: 59 y.o. MRN: [id number]  The patient is here for annual  wellness examination and management of other chronic and acute problems.   The risk factors are reflected in the social history.  The roster of all physicians providing medical care to patient - is listed in the Snapshot section of the chart.  Activities of daily living:  The patient is 100% independent in all ADLs: dressing, toileting, feeding as well as independent mobility  Home safety : The patient has smoke detectors in the home. They wear seatbelts.  There are no firearms at home. There is no violence in the home.   There is no risks for hepatitis, STDs or HIV. There is no   history of blood transfusion. They have no travel history to infectious disease endemic areas of the world.  The patient has seen their dentist in the last six month. They have seen their eye doctor in the last year. They admit to slight hearing difficulty with regard to whispered voices and some television programs.  They have deferred audiologic testing in the last year.  They do not  have excessive sun exposure. Discussed the need for sun protection: hats, long sleeves and use of sunscreen if there is significant sun exposure.   Diet: the importance of a healthy diet is discussed. They do have a healthy diet.  The benefits of regular aerobic exercise were discussed. She walks 4 times per week ,  20 minutes.   Depression screen: there are no signs or vegative symptoms of depression- irritability, change in appetite, anhedonia, sadness/tearfullness.  Cognitive assessment: the patient manages all their financial and personal affairs and is actively engaged. They could relate day,date,year and events; recalled 2/3 objects at 3 minutes; performed clock-face test normally.  The following portions of the patient's history were reviewed and updated as appropriate: allergies, current medications, past family  history, past medical history,  past surgical history, past social history  and problem list.  Visual acuity was not assessed per patient preference since she has regular follow up with her ophthalmologist. Hearing and body mass index were assessed and reviewed.   During the course of the visit the patient was educated and counseled about appropriate screening and preventive services including : fall prevention , diabetes screening, nutrition counseling, colorectal cancer screening, and recommended immunizations.    CC: The primary encounter diagnosis was Hyperlipidemia. Diagnoses of Vitamin D deficiency, Screen for STD (sexually transmitted disease), Other fatigue, Screening for breast cancer, Screening for osteoporosis, and Visit for preventive health examination were also pertinent to this visit.  History Mariea has a past medical history of Pinguecula of right eye; Nephrolithiasis (1987); and Cervical dysplasia (1980).   She has past surgical history that includes Breast surgery (JAN 2012); Foot surgery (2006); Tubal ligation (1990); Cesarean section; and pilonidal (1979).   Her family history includes Aneurysm (age of onset: 62) in her mother; Birth defects in her maternal uncle; Stroke in her mother. There is no history of Cancer.She reports that she has never smoked. She has never used smokeless tobacco. She reports that she drinks alcohol. She reports that she does not use illicit drugs.  Outpatient Prescriptions Prior to Visit  Medication Sig Dispense Refill  . Calcium Carbonate-Vitamin D (CALCIUM + D) 600-200 MG-UNIT TABS Take by mouth.     No facility-administered medications prior to visit.    Review of Systems   Patient denies headache, fevers, malaise,  unintentional weight loss, skin rash, eye pain, sinus congestion and sinus pain, sore throat, dysphagia,  hemoptysis , cough, dyspnea, wheezing, chest pain, palpitations, orthopnea, edema, abdominal pain, nausea, melena, diarrhea,  constipation, flank pain, dysuria, hematuria, urinary  Frequency, nocturia, numbness, tingling, seizures,  Focal weakness, Loss of consciousness,  Tremor, insomnia, depression, anxiety, and suicidal ideation.      Objective:  BP 98/64 mmHg  Pulse 59  Temp(Src) 98.1 F (36.7 C) (Oral)  Resp 12  Ht 5' 0.75" (1.543 m)  Wt 97 lb 12 oz (44.339 kg)  BMI 18.62 kg/m2  SpO2 98%  Physical Exam   General appearance: alert, cooperative and appears stated age Head: Normocephalic, without obvious abnormality, atraumatic Eyes: conjunctivae/corneas clear. PERRL, EOM's intact. Fundi benign. Ears: normal TM's and external ear canals both ears Nose: Nares normal. Septum midline. Mucosa normal. No drainage or sinus tenderness. Throat: lips, mucosa, and tongue normal; teeth and gums normal Neck: no adenopathy, no carotid bruit, no JVD, supple, symmetrical, trachea midline and thyroid not enlarged, symmetric, no tenderness/mass/nodules Lungs: clear to auscultation bilaterally Breasts: normal appearance, no masses or tenderness Heart: regular rate and rhythm, S1, S2 normal, no murmur, click, rub or gallop Abdomen: soft, non-tender; bowel sounds normal; no masses,  no organomegaly Extremities: extremities normal, atraumatic, no cyanosis or edema Pulses: 2+ and symmetric Skin: Skin color, texture, turgor normal. No rashes or lesions Neurologic: Alert and oriented X 3, normal strength and tone. Normal symmetric reflexes. Normal coordination and gait.     Assessment & Plan:   Problem List Items Addressed This Visit      Unprioritized   Visit for preventive health examination    Annual comprehensive preventive exam was done as well as an evaluation and management of chronic conditions .  During the course of the visit the patient was educated and counseled about appropriate screening and preventive services including :  diabetes screening, lipid analysis with projected  10 year  risk for CAD  Which is  4.7 % using the Framingham risk calculator for women, , nutrition counseling, colorectal cancer screening, and recommended immunizations.  Printed recommendations for health maintenance screenings was given.        Other Visit Diagnoses    Hyperlipidemia    -  Primary    Relevant Orders    Lipid panel (Completed)    Vitamin D deficiency        Relevant Orders    Vit D  25 hydroxy (rtn osteoporosis monitoring) (Completed)    Screen for STD (sexually transmitted disease)        Relevant Orders    HIV antibody (Completed)    Hepatitis C antibody (Completed)    Other fatigue        Relevant Orders    TSH (Completed)    CBC with Differential/Platelet (Completed)    Comprehensive metabolic panel (Completed)    Screening for breast cancer        Relevant Orders    MM DIGITAL SCREENING BILATERAL    Screening for osteoporosis        Relevant Orders    DG Bone Density       I am having Ms. Fordham maintain her Calcium Carbonate-Vitamin D.  No orders of the defined types were placed in this encounter.    There are no discontinued medications.  Follow-up: No Follow-up on file.   [name], MD

## 2015-01-17 NOTE — Assessment & Plan Note (Signed)
Annual comprehensive preventive exam was done as well as an evaluation and management of chronic conditions .  During the course of the visit the patient was educated and counseled about appropriate screening and preventive services including :  diabetes screening, lipid analysis with projected  10 year  risk for CAD  Which is 4.7 % using the Framingham risk calculator for women, , nutrition counseling, colorectal cancer screening, and recommended immunizations.  Printed recommendations for health maintenance screenings was given.  

## 2015-01-20 ENCOUNTER — Encounter: Payer: Self-pay | Admitting: *Deleted

## 2015-01-30 ENCOUNTER — Telehealth: Payer: Self-pay | Admitting: *Deleted

## 2015-01-30 NOTE — Telephone Encounter (Signed)
Pt called requesting status of mammogram and bone density appoints.  Ordered on 8.24.16.  Please advise

## 2015-02-06 ENCOUNTER — Encounter: Payer: Self-pay | Admitting: Internal Medicine

## 2015-02-20 ENCOUNTER — Ambulatory Visit
Admission: RE | Admit: 2015-02-20 | Discharge: 2015-02-20 | Disposition: A | Discharge status: Home / Self Care | Payer: BLUE CROSS/BLUE SHIELD | Source: Ambulatory Visit | Attending: Internal Medicine | Admitting: Internal Medicine

## 2015-02-20 ENCOUNTER — Other Ambulatory Visit: Payer: BLUE CROSS/BLUE SHIELD

## 2015-02-20 DIAGNOSIS — Z1239 Encounter for other screening for malignant neoplasm of breast: Secondary | ICD-10-CM

## 2015-02-24 ENCOUNTER — Ambulatory Visit
Admission: RE | Admit: 2015-02-24 | Discharge: 2015-02-24 | Disposition: A | Discharge status: Home / Self Care | Payer: BLUE CROSS/BLUE SHIELD | Source: Ambulatory Visit | Attending: Internal Medicine | Admitting: Internal Medicine

## 2015-02-24 DIAGNOSIS — Z1231 Encounter for screening mammogram for malignant neoplasm of breast: Secondary | ICD-10-CM | POA: Diagnosis present

## 2015-02-24 DIAGNOSIS — Z13820 Encounter for screening for osteoporosis: Secondary | ICD-10-CM | POA: Insufficient documentation

## 2015-02-24 DIAGNOSIS — M8580 Other specified disorders of bone density and structure, unspecified site: Secondary | ICD-10-CM | POA: Insufficient documentation

## 2015-02-28 ENCOUNTER — Encounter: Payer: Self-pay | Admitting: *Deleted

## 2015-09-12 ENCOUNTER — Encounter: Payer: Self-pay | Admitting: Emergency Medicine

## 2015-09-12 ENCOUNTER — Emergency Department: Payer: BLUE CROSS/BLUE SHIELD

## 2015-09-12 ENCOUNTER — Emergency Department: Payer: BLUE CROSS/BLUE SHIELD | Admitting: Anesthesiology

## 2015-09-12 ENCOUNTER — Encounter
Admission: EM | Disposition: A | Discharge status: Home / Self Care | Payer: Self-pay | Source: Home / Self Care | Attending: Emergency Medicine

## 2015-09-12 ENCOUNTER — Emergency Department
Admission: EM | Admit: 2015-09-12 | Discharge: 2015-09-12 | Disposition: A | Discharge status: Home / Self Care | Payer: BLUE CROSS/BLUE SHIELD | Attending: Emergency Medicine | Admitting: Emergency Medicine

## 2015-09-12 DIAGNOSIS — N201 Calculus of ureter: Secondary | ICD-10-CM | POA: Diagnosis not present

## 2015-09-12 DIAGNOSIS — N132 Hydronephrosis with renal and ureteral calculous obstruction: Secondary | ICD-10-CM | POA: Diagnosis not present

## 2015-09-12 DIAGNOSIS — R112 Nausea with vomiting, unspecified: Secondary | ICD-10-CM | POA: Diagnosis not present

## 2015-09-12 DIAGNOSIS — N200 Calculus of kidney: Secondary | ICD-10-CM | POA: Diagnosis present

## 2015-09-12 DIAGNOSIS — R1031 Right lower quadrant pain: Secondary | ICD-10-CM | POA: Diagnosis not present

## 2015-09-12 HISTORY — PX: CYSTOSCOPY WITH STENT PLACEMENT: SHX5790

## 2015-09-12 LAB — URINALYSIS COMPLETE WITH MICROSCOPIC (ARMC ONLY)
Bilirubin Urine: NEGATIVE
GLUCOSE, UA: NEGATIVE mg/dL
NITRITE: NEGATIVE
Protein, ur: 100 mg/dL — AB
SPECIFIC GRAVITY, URINE: 1.021 (ref 1.005–1.030)
pH: 6 (ref 5.0–8.0)

## 2015-09-12 LAB — COMPREHENSIVE METABOLIC PANEL
ALK PHOS: 69 U/L (ref 38–126)
ALT: 13 U/L — ABNORMAL LOW (ref 14–54)
AST: 24 U/L (ref 15–41)
Albumin: 5.1 g/dL — ABNORMAL HIGH (ref 3.5–5.0)
Anion gap: 11 (ref 5–15)
BILIRUBIN TOTAL: 0.9 mg/dL (ref 0.3–1.2)
BUN: 26 mg/dL — ABNORMAL HIGH (ref 6–20)
CALCIUM: 10.2 mg/dL (ref 8.9–10.3)
CO2: 25 mmol/L (ref 22–32)
Chloride: 104 mmol/L (ref 101–111)
Creatinine, Ser: 0.8 mg/dL (ref 0.44–1.00)
GFR calc non Af Amer: >60 mL/min (ref >60)
Glucose, Bld: 114 mg/dL — ABNORMAL HIGH (ref 65–99)
Potassium: 3.5 mmol/L (ref 3.5–5.1)
SODIUM: 140 mmol/L (ref 135–145)
TOTAL PROTEIN: 7.4 g/dL (ref 6.5–8.1)

## 2015-09-12 LAB — CBC WITH DIFFERENTIAL/PLATELET
Basophils Absolute: 0.1 10*3/uL (ref 0–0.1)
Basophils Relative: 1 %
EOS ABS: 0.1 10*3/uL (ref 0–0.7)
Eosinophils Relative: 1 %
HEMATOCRIT: 43 % (ref 35.0–47.0)
HEMOGLOBIN: 14.7 g/dL (ref 12.0–16.0)
LYMPHS ABS: 3.2 10*3/uL (ref 1.0–3.6)
Lymphocytes Relative: 44 %
MCH: 30 pg (ref 26.0–34.0)
MCHC: 34.2 g/dL (ref 32.0–36.0)
MCV: 87.6 fL (ref 80.0–100.0)
MONO ABS: 0.6 10*3/uL (ref 0.2–0.9)
MONOS PCT: 8 %
NEUTROS PCT: 46 %
Neutro Abs: 3.3 10*3/uL (ref 1.4–6.5)
Platelets: 396 10*3/uL (ref 150–440)
RBC: 4.9 MIL/uL (ref 3.80–5.20)
RDW: 13.2 % (ref 11.5–14.5)
WBC: 7.3 10*3/uL (ref 3.6–11.0)

## 2015-09-12 LAB — LIPASE, BLOOD: Lipase: 24 U/L (ref 11–51)

## 2015-09-12 SURGERY — CYSTOSCOPY, WITH STENT INSERTION
Anesthesia: General | Laterality: Right

## 2015-09-12 MED ORDER — IOTHALAMATE MEGLUMINE 43 % IV SOLN
INTRAVENOUS | Status: DC | PRN
  Administered 2015-09-12: 9 mL

## 2015-09-12 MED ORDER — SODIUM CHLORIDE 0.9 % IV BOLUS (SEPSIS)
1000.0000 mL | Freq: Once | INTRAVENOUS | Status: AC
  Administered 2015-09-12: 1000 mL via INTRAVENOUS

## 2015-09-12 MED ORDER — PROPOFOL 10 MG/ML IV BOLUS
INTRAVENOUS | Status: DC | PRN
  Administered 2015-09-12: 100 mg via INTRAVENOUS

## 2015-09-12 MED ORDER — LACTATED RINGERS IV SOLN
INTRAVENOUS | Status: DC | PRN
  Administered 2015-09-12: 05:00:00 via INTRAVENOUS

## 2015-09-12 MED ORDER — MORPHINE SULFATE (PF) 4 MG/ML IV SOLN
4.0000 mg | Freq: Once | INTRAVENOUS | Status: AC
  Administered 2015-09-12: 4 mg via INTRAVENOUS

## 2015-09-12 MED ORDER — ONDANSETRON HCL 4 MG/2ML IJ SOLN
INTRAMUSCULAR | Status: AC
  Administered 2015-09-12: 4 mg via INTRAVENOUS
  Filled 2015-09-12: qty 2

## 2015-09-12 MED ORDER — ONDANSETRON HCL 4 MG/2ML IJ SOLN
4.0000 mg | Freq: Once | INTRAMUSCULAR | Status: DC | PRN
Start: 2015-09-12 — End: 2015-09-12

## 2015-09-12 MED ORDER — MIDAZOLAM HCL 2 MG/2ML IJ SOLN
INTRAMUSCULAR | Status: DC | PRN
  Administered 2015-09-12: 2 mg via INTRAVENOUS

## 2015-09-12 MED ORDER — TROSPIUM CHLORIDE ER 60 MG PO CP24: 60.0000 mg | ORAL_CAPSULE | Freq: Every day | ORAL | Status: DC

## 2015-09-12 MED ORDER — ONDANSETRON HCL 4 MG/2ML IJ SOLN
4.0000 mg | Freq: Once | INTRAMUSCULAR | Status: AC
  Administered 2015-09-12: 4 mg via INTRAVENOUS

## 2015-09-12 MED ORDER — LIDOCAINE HCL (PF) 4 % IJ SOLN
INTRAMUSCULAR | Status: DC | PRN
  Administered 2015-09-12: 4 mL via RESPIRATORY_TRACT

## 2015-09-12 MED ORDER — SUGAMMADEX SODIUM 200 MG/2ML IV SOLN
INTRAVENOUS | Status: DC | PRN
  Administered 2015-09-12: 200 mg via INTRAVENOUS

## 2015-09-12 MED ORDER — TRAMADOL HCL 50 MG PO TABS: 50.0000 mg | ORAL_TABLET | Freq: Four times a day (QID) | ORAL | Status: DC | PRN

## 2015-09-12 MED ORDER — LIDOCAINE HCL (CARDIAC) 20 MG/ML IV SOLN
INTRAVENOUS | Status: DC | PRN
  Administered 2015-09-12: 40 mg via INTRAVENOUS

## 2015-09-12 MED ORDER — PHENAZOPYRIDINE HCL 200 MG PO TABS: 200.0000 mg | ORAL_TABLET | Freq: Three times a day (TID) | ORAL | Status: DC | PRN

## 2015-09-12 MED ORDER — FENTANYL CITRATE (PF) 100 MCG/2ML IJ SOLN: 25.0000 ug | INTRAMUSCULAR | Status: DC | PRN

## 2015-09-12 MED ORDER — MORPHINE SULFATE (PF) 4 MG/ML IV SOLN
INTRAVENOUS | Status: AC
  Administered 2015-09-12: 4 mg via INTRAVENOUS
  Filled 2015-09-12: qty 1

## 2015-09-12 MED ORDER — FLUMAZENIL 0.5 MG/5ML IV SOLN
INTRAVENOUS | Status: DC | PRN
  Administered 2015-09-12: 0.2 mg via INTRAVENOUS

## 2015-09-12 MED ORDER — KETOROLAC TROMETHAMINE 30 MG/ML IJ SOLN: 30.0000 mg | Freq: Once | INTRAMUSCULAR | Status: DC

## 2015-09-12 MED ORDER — BELLADONNA ALKALOIDS-OPIUM 16.2-60 MG RE SUPP
RECTAL | Status: DC | PRN
  Administered 2015-09-12: 1 via RECTAL

## 2015-09-12 MED ORDER — CIPROFLOXACIN IN D5W 400 MG/200ML IV SOLN
400.0000 mg | Freq: Once | INTRAVENOUS | Status: DC
  Filled 2015-09-12: qty 200

## 2015-09-12 MED ORDER — FENTANYL CITRATE (PF) 100 MCG/2ML IJ SOLN
INTRAMUSCULAR | Status: DC | PRN
  Administered 2015-09-12: 100 ug via INTRAVENOUS

## 2015-09-12 MED ORDER — MORPHINE SULFATE (PF) 4 MG/ML IV SOLN
INTRAVENOUS | Status: DC
Start: 2015-09-12 — End: 2015-09-12
  Filled 2015-09-12: qty 1

## 2015-09-12 MED ORDER — ONDANSETRON HCL 4 MG/2ML IJ SOLN
INTRAMUSCULAR | Status: DC | PRN
Start: 2015-09-12 — End: 2015-09-12
  Administered 2015-09-12: 4 mg via INTRAVENOUS

## 2015-09-12 MED ORDER — EPHEDRINE SULFATE 50 MG/ML IJ SOLN
INTRAMUSCULAR | Status: DC | PRN
  Administered 2015-09-12 (×2): 5 mg via INTRAVENOUS

## 2015-09-12 MED ORDER — ROCURONIUM BROMIDE 100 MG/10ML IV SOLN
INTRAVENOUS | Status: DC | PRN
  Administered 2015-09-12: 30 mg via INTRAVENOUS

## 2015-09-12 SURGICAL SUPPLY — 19 items
BAG DRAIN CYSTO-URO LG1000N (MISCELLANEOUS) ×3 IMPLANT
CATH URETL 5X70 OPEN END (CATHETERS) ×3 IMPLANT
GLOVE BIO SURGEON STRL SZ7 (GLOVE) ×6 IMPLANT
GLOVE BIO SURGEON STRL SZ7.5 (GLOVE) ×3 IMPLANT
GOWN STRL REUS W/ TWL LRG LVL3 (GOWN DISPOSABLE) ×2 IMPLANT
GOWN STRL REUS W/TWL LRG LVL3 (GOWN DISPOSABLE) ×4
PACK CYSTO AR (MISCELLANEOUS) ×3 IMPLANT
PREP PVP WINGED SPONGE (MISCELLANEOUS) ×3 IMPLANT
SENSORWIRE 0.038 NOT ANGLED (WIRE) ×3
SET CYSTO W/LG BORE CLAMP LF (SET/KITS/TRAYS/PACK) ×3 IMPLANT
SOL .9 NS 3000ML IRR  AL (IV SOLUTION) ×2
SOL .9 NS 3000ML IRR UROMATIC (IV SOLUTION) ×1 IMPLANT
SOL PREP PVP 2OZ (MISCELLANEOUS) ×3
SOLUTION PREP PVP 2OZ (MISCELLANEOUS) ×1 IMPLANT
STENT URET 6FRX24 CONTOUR (STENTS) ×3 IMPLANT
STENT URET 6FRX26 CONTOUR (STENTS) IMPLANT
SURGILUBE 2OZ TUBE FLIPTOP (MISCELLANEOUS) ×3 IMPLANT
WATER STERILE IRR 1000ML POUR (IV SOLUTION) ×3 IMPLANT
WIRE SENSOR 0.038 NOT ANGLED (WIRE) ×1 IMPLANT

## 2015-09-12 NOTE — ED Notes (Signed)
Pt taken to CT.

## 2015-09-12 NOTE — Discharge Instructions (Signed)
DISCHARGE INSTRUCTIONS FOR KIDNEY STONE/URETERAL STENT   MEDICATIONS:  1.  Resume all your other meds from home - except do not take any extra narcotic pain meds that you may have at home.  2. Trospium is to prevent bladder spasms and help reduce urinary frequency. 3. Pyridium is to help with the burning/stinging when you urinate. 4. Tramadol is for moderate/severe pain, otherwise taking upto 1000mg  every 6 hours of plainTylenol will help treat your pain.  Do not take both at the same time. 5. Take Cipro one hour prior to removal of your stent.   ACTIVITY:  1. No strenuous activity x 1week  2. No driving while on narcotic pain medications  3. Drink plenty of water  4. Continue to walk at home - you can still get blood clots when you are at home, so keep active, but don't over do it.  5. May return to work/school tomorrow or when you feel ready   BATHING:  1. You can shower and we recommend daily showers    SIGNS/SYMPTOMS TO CALL:  Please call us if you have a fever greater than 101.5, uncontrolled nausea/vomiting, uncontrolled pain, dizziness, unable to urinate, bloody urine, chest pain, shortness of breath, leg swelling, leg pain, redness around wound, drainage from wound, or any other concerns or questions.   You can reach [place] at [phone].   FOLLOW-UP:  1. You will be scheduled for follow-up [facility] to meet with Dr. Erlene Quan and establish a definitive plan for stone management.

## 2015-09-12 NOTE — ED Notes (Signed)
Consent signed by husband secondary to patient receiving several doses of Morphine. Patient gave verbal consent for husband to sign. Consent signed for cystoscopy with right ureteral sten placement secondary to right obstructing kidney stone. Joelene Millin in the [place] called and is ready for the patient. OR asked that Cipro come with patient to the OR; obtained from pyxis.

## 2015-09-12 NOTE — ED Notes (Signed)
Pt in with co right flank pain x 30 min hx of kidney stones.

## 2015-09-12 NOTE — ED Provider Notes (Signed)
[facility] Emergency Department Provider Note  ____________________________________________  Time seen: 1:15 AM  I have reviewed the triage vital signs and the nursing notes.   HISTORY  Chief Complaint Abdominal Pain      HPI Megan Mcintosh is a 60 y.o. female presents with acute onset of 10 out of 10 right flank pain probably 30 minutes before presentation. Pain is  accompanied by nausea patient denies any fever afebrile on presentation with temperature 98.3. Of note patient has a history of kidney stones in the past.     Past Medical History  Diagnosis Date  . Pinguecula of right eye     removed by Amado Nash  . Nephrolithiasis 1987  . Cervical dysplasia 1980    s/p freezing    Patient Active Problem List   Diagnosis Date Noted  . Visit for preventive health examination 12/12/2012  . Abdominal pain, unspecified site 11/27/2012  . 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 Cervical dysplasia   . Onychomycosis of toenail 07/25/2011  . Nephrolithiasis   . Pinguecula of right eye     Past Surgical History  Procedure Laterality Date  . Breast surgery  JAN 2012    right breast , BENIGN Rochel Brome  . Foot surgery  2006    [facility]:  silicone implants in both great toes   . Tubal ligation  1990  . Cesarean section      x 2  . Pilonidal  1979    cyst excised   . Breast biopsy Right 2012    Current Outpatient Rx  Name  Route  Sig  Dispense  Refill  . Calcium Carbonate-Vitamin D (CALCIUM + D) 600-200 MG-UNIT TABS   Oral   Take by mouth.           Allergies Review of patient's allergies indicates no known allergies.  Family History  Problem Relation Age of Onset  . Aneurysm Mother 55    Hemorrhage CVA  . Stroke Mother   . Birth defects Maternal Uncle   . Cancer Neg Hx     Social History Social History  Substance Use Topics  . Smoking status: Never Smoker   . Smokeless tobacco: Never Used  . Alcohol Use: Yes    Review  of Systems  Constitutional: Negative for fever. Eyes: Negative for visual changes. ENT: Negative for sore throat. Cardiovascular: Negative for chest pain. Respiratory: Negative for shortness of breath. Gastrointestinal: Negative for abdominal pain, vomiting and diarrhea.Positive for nausea Genitourinary: Negative for dysuria. Musculoskeletal: Positive for right flank pain Skin: Negative for rash. Neurological: Negative for headaches, focal weakness or numbness.   10-point ROS otherwise negative.  ____________________________________________   PHYSICAL EXAM:  VITAL SIGNS: ED Triage Vitals  Enc Vitals Group     BP 09/12/15 0106 97/69 mmHg     Pulse Rate 09/12/15 0106 50     Resp 09/12/15 0106 18     Temp 09/12/15 0106 98.3 F (36.8 C)     Temp Source 09/12/15 0106 Oral     SpO2 09/12/15 0106 100 %     Weight 09/12/15 0106 97 lb (43.999 kg)     Height 09/12/15 0106 5' (1.524 m)     Head Cir --      Peak Flow --      Pain Score 09/12/15 0106 9     Pain Loc --      Pain Edu? --      Excl. in [place]? --  Constitutional: Alert and oriented. Apparent discomfort Eyes: Conjunctivae are normal. PERRL. Normal extraocular movements. ENT   Head: Normocephalic and atraumatic.   Nose: No congestion/rhinnorhea.   Mouth/Throat: Mucous membranes are moist.   Neck: No stridor. Hematological/Lymphatic/Immunilogical: No cervical lymphadenopathy. Cardiovascular: Normal rate, regular rhythm. Normal and symmetric distal pulses are present in all extremities. No murmurs, rubs, or gallops. Respiratory: Normal respiratory effort without tachypnea nor retractions. Breath sounds are clear and equal bilaterally. No wheezes/rales/rhonchi. Gastrointestinal: Soft and nontender. No distention. There is no CVA tenderness. Genitourinary: deferred Musculoskeletal: Nontender with normal range of motion in all extremities. No joint effusions.  No lower extremity tenderness nor  edema. Neurologic:  Normal speech and language. No gross focal neurologic deficits are appreciated. Speech is normal.  Skin:  Skin is warm, dry and intact. No rash noted. Psychiatric: Mood and affect are normal. Speech and behavior are normal. Patient exhibits appropriate insight and judgment.  ____________________________________________    LABS (pertinent positives/negatives)  Labs Reviewed  COMPREHENSIVE METABOLIC PANEL - Abnormal; Notable for the following:    Glucose, Bld 114 (*)    BUN 26 (*)    Albumin 5.1 (*)    ALT 13 (*)    All other components within normal limits  URINALYSIS COMPLETEWITH MICROSCOPIC ([facility] ONLY) - Abnormal; Notable for the following:    Color, Urine RED (*)    APPearance CLOUDY (*)    Ketones, ur 1+ (*)    Hgb urine dipstick 3+ (*)    Protein, ur 100 (*)    Leukocytes, UA 1+ (*)    Bacteria, UA RARE (*)    Squamous Epithelial / LPF 0-5 (*)    All other components within normal limits  CBC WITH DIFFERENTIAL/PLATELET  LIPASE, BLOOD         RADIOLOGY   CT RENAL STONE STUDY (Final result) Result time: 09/12/15 02:28:56   Final result by Rad Results In Interface (09/12/15 02:28:56)   Narrative:   CLINICAL DATA: Right flank pain for 30 minutes. History of kidney stones.  EXAM: CT ABDOMEN AND PELVIS WITHOUT CONTRAST  TECHNIQUE: Multidetector CT imaging of the abdomen and pelvis was performed following the standard protocol without IV contrast.  COMPARISON: 11/27/2012  FINDINGS: The lung bases are clear.  Pelvic location of the right kidney. The kidney appears to have rotated horizontally and inferiorly since the previous study. There are multiple bilateral intrarenal stones. Largest is in the lower pole of the left kidney and measures about 5 mm in diameter. There are 2 stones in the right renal pelvis at the ureteropelvic junction, measuring together about 7 mm. There is moderate hydronephrosis of the right kidney. Ureters are  decompressed and no additional ureteral stones are demonstrated. Bladder is decompressed.  The unenhanced appearance of the liver, spleen, gallbladder, pancreas, adrenal glands, abdominal aorta, inferior vena cava, and retroperitoneal lymph nodes is unremarkable. Stomach, small bowel, and colon are not abnormally distended. Stool fills the colon. No free air or free fluid in the abdomen.  Pelvis: Uterus and ovaries are not enlarged. No free or loculated pelvic fluid collections. No pelvic mass or lymphadenopathy. Degenerative changes in the spine and hips. No destructive bone lesions.  IMPRESSION: Two stones in the right ureteropelvic junction with moderate proximal obstruction. Multiple bilateral nonobstructing intrarenal stones. The kidney appears to have rotated towards the pelvis since the previous study.   Electronically Signed By: Lucienne Capers M.D. On: 09/12/2015 02:28          INITIAL IMPRESSION / ASSESSMENT AND  PLAN / ED COURSE  Pertinent labs & imaging results that were available during my care of the patient were reviewed by me and considered in my medical decision making (see chart for details).  Patient received 4 doses of morphine 4 mg each with continued right flank discomfort. Patient discussed with [place] urologist on call with plan to take the patient for a ureteral stent.  ____________________________________________   FINAL CLINICAL IMPRESSION(S) / ED DIAGNOSES  Final diagnoses:  Right kidney stone      Gregor Hams, MD 09/12/15 [id number]

## 2015-09-12 NOTE — Op Note (Signed)
Preoperative diagnosis:  1. 2 right proximal ureteral stones   Postoperative diagnosis:  1. Same   Procedure:  1. Cystoscopy 2. right ureteral stent placement 3. right retrograde pyelography with interpretation   Surgeon: Ardis Hughs, MD  Anesthesia: General  Complications: None  Intraoperative findings: Patient had a normal caliber ureter to the proximal ureter/UVJ where she had a filling defect consistent with the patient's known stones. She had proximal hydronephrosis.  EBL: Minimal  Specimens: None  Indication: Megan Mcintosh is a 60 y.o. patient with poorly controlled pain and to proximal ureteral stones. After reviewing the management options for treatment, he elected to proceed with the above surgical procedure(s). We have discussed the potential benefits and risks of the procedure, side effects of the proposed treatment, the likelihood of the patient achieving the goals of the procedure, and any potential problems that might occur during the procedure or recuperation. Informed consent has been obtained.  Description of procedure:  The patient was taken to the operating room and general anesthesia was induced.  The patient was placed in the dorsal lithotomy position, prepped and draped in the usual sterile fashion, and preoperative antibiotics were administered. A preoperative time-out was performed.   Cystourethroscopy was performed.  The patient's urethra was examined and was normal. The bladder was then systematically examined in its entirety. There was no evidence for any bladder tumors, stones, or other mucosal pathology.    Attention then turned to the rightureteral orifice and a ureteral catheter was used to intubate the ureteral orifice.  Omnipaque contrast was injected through the ureteral catheter and a retrograde pyelogram was performed with findings as dictated above.  A 0.38 sensor guidewire was then advanced up the right ureter into the renal pelvis under  fluoroscopic guidance.  The wire was then backloaded through the cystoscope and a ureteral stent was advance over the wire using Seldinger technique.  The stent was positioned appropriately under fluoroscopic and cystoscopic guidance.  The wire was then removed with an adequate stent curl noted in the renal pelvis as well as in the bladder.  The bladder was then emptied and the procedure ended.  The patient appeared to tolerate the procedure well and without complications.  The patient was able to be awakened and transferred to the recovery unit in satisfactory condition.    Ardis Hughs, M.D.

## 2015-09-12 NOTE — Anesthesia Procedure Notes (Signed)
Procedure Name: Intubation Date/Time: 09/12/2015 5:35 AM Performed by: Rosaria Ferries, Mckensie Scotti Pre-anesthesia Checklist: Patient identified, Emergency Drugs available, Suction available and Patient being monitored Patient Re-evaluated:Patient Re-evaluated prior to inductionOxygen Delivery Method: Circle system utilized Preoxygenation: Pre-oxygenation with 100% oxygen Intubation Type: IV induction Laryngoscope Size: Mac and 3 Grade View: Grade I Tube type: Oral Tube size: 7.0 mm Number of attempts: 1 Placement Confirmation: ETT inserted through vocal cords under direct vision,  positive ETCO2 and breath sounds checked- equal and bilateral Secured at: 21 cm Tube secured with: Tape Dental Injury: Teeth and Oropharynx as per pre-operative assessment

## 2015-09-12 NOTE — Transfer of Care (Signed)
Immediate Anesthesia Transfer of Care Note  Patient: Megan Mcintosh  Procedure(s) Performed: Procedure(s): CYSTOSCOPY WITH STENT PLACEMENT (Right)  Patient Location: PACU  Anesthesia Type:General  Level of Consciousness: awake and patient cooperative  Airway & Oxygen Therapy: Patient Spontanous Breathing and Patient connected to face mask oxygen  Post-op Assessment: Report given to RN and Post -op Vital signs reviewed and stable  Post vital signs: Reviewed and stable  Last Vitals:  Filed Vitals:   09/12/15 0455 09/12/15 0615  BP:  108/59  Pulse: 25 54  Temp:    Resp:  8    Complications: No apparent anesthesia complications

## 2015-09-12 NOTE — Anesthesia Preprocedure Evaluation (Addendum)
Anesthesia Evaluation  Patient identified by MRN, date of birth, ID band Patient awake    Reviewed: Allergy & Precautions, H&P , NPO status , Patient's Chart, lab work & pertinent test results, reviewed documented beta blocker date and time   History of Anesthesia Complications Negative for: history of anesthetic complications  Airway Mallampati: I  TM Distance: >3 FB Neck ROM: full    Dental no notable dental hx. (+) Caps, Teeth Intact   Pulmonary neg pulmonary ROS,    Pulmonary exam normal breath sounds clear to auscultation       Cardiovascular Exercise Tolerance: Good negative cardio ROS Normal cardiovascular exam Rhythm:regular Rate:Normal     Neuro/Psych negative neurological ROS  negative psych ROS   GI/Hepatic negative GI ROS, Neg liver ROS,   Endo/Other  negative endocrine ROS  Renal/GU Renal disease (2 proximal kidney stones)  negative genitourinary   Musculoskeletal   Abdominal   Peds  Hematology negative hematology ROS (+)   Anesthesia Other Findings Past Medical History:   Pinguecula of right eye                                        Comment:removed by Amado Nash   Nephrolithiasis                                 1987         Cervical dysplasia                              1980           Comment:s/p freezing   Reproductive/Obstetrics negative OB ROS                             Anesthesia Physical Anesthesia Plan  ASA: II and emergent  Anesthesia Plan: General   Post-op Pain Management:    Induction:   Airway Management Planned:   Additional Equipment:   Intra-op Plan:   Post-operative Plan:   Informed Consent: I have reviewed the patients History and Physical, chart, labs and discussed the procedure including the risks, benefits and alternatives for the proposed anesthesia with the patient or authorized representative who has indicated his/her  understanding and acceptance.   Dental Advisory Given  Plan Discussed with: Anesthesiologist, CRNA and Surgeon  Anesthesia Plan Comments:        Anesthesia Quick Evaluation

## 2015-09-12 NOTE — H&P (Signed)
I have been asked to see the patient by Dr. Marjean Donna, MD, for evaluation and management of right proximal ureteral stones.  History of present illness: 28F who presented to the ED early this AM with acute onset right flank pain.  She has associated nausea and vomitting.  She denies any fevers/chills.  Her pain is 10/10 and predominately in her right flank.  She has had three doses of IV morphine with no significant improvement.  She had a CT scan demonstrating two small stones in the right UPJ that combined measure up to 66mm.  She has dilated right kidney.  Her labs are unremarkable.  Her urine analysis demonstrates pyuria without nitrites.    She has a history of nephrolithiasis in the past.   Review of systems: A 12 point comprehensive review of systems was obtained and is negative unless otherwise stated in the history of present illness.  Patient Active Problem List   Diagnosis Date Noted  . Visit for preventive health examination 12/12/2012  . Abdominal pain, unspecified site 11/27/2012  . 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 Cervical dysplasia   . Onychomycosis of toenail 07/25/2011  . Nephrolithiasis   . Pinguecula of right eye     No current facility-administered medications on file prior to encounter.   Current Outpatient Prescriptions on File Prior to Encounter  Medication Sig Dispense Refill  . Calcium Carbonate-Vitamin D (CALCIUM + D) 600-200 MG-UNIT TABS Take by mouth.      Past Medical History  Diagnosis Date  . Pinguecula of right eye     removed by Amado Nash  . Nephrolithiasis 1987  . Cervical dysplasia 1980    s/p freezing    Past Surgical History  Procedure Laterality Date  . Breast surgery  JAN 2012    right breast , BENIGN Rochel Brome  . Foot surgery  2006    [facility]:  silicone implants in both great toes   . Tubal ligation  1990  . Cesarean section      x 2  . Pilonidal  1979    cyst excised   . Breast biopsy Right 2012    Social  History  Substance Use Topics  . Smoking status: Never Smoker   . Smokeless tobacco: Never Used  . Alcohol Use: Yes    Family History  Problem Relation Age of Onset  . Aneurysm Mother 37    Hemorrhage CVA  . Stroke Mother   . Birth defects Maternal Uncle   . Cancer Neg Hx     PE: Filed Vitals:   09/12/15 0200 09/12/15 0230 09/12/15 0300 09/12/15 0330  BP: 120/54 120/60 110/65 115/65  Pulse: 50 52 51 55  Temp:      TempSrc:      Resp:      Height:      Weight:      SpO2: 98% 100% 100% 96%   Patient appears to be in no acute distress  patient is alert and oriented x3 Atraumatic normocephalic head No cervical or supraclavicular lymphadenopathy appreciated No increased work of breathing, no audible wheezes/rhonchi Regular sinus rhythm/rate Abdomen is soft, nontender, nondistended, no CVA or suprapubic tenderness Lower extremities are symmetric without appreciable edema Grossly neurologically intact No identifiable skin lesions   Recent Labs  09/12/15 0124  WBC 7.3  HGB 14.7  HCT 43.0    Recent Labs  09/12/15 0124  NA 140  K 3.5  CL 104  CO2 25  GLUCOSE 114*  BUN  26*  CREATININE 0.80  CALCIUM 10.2   No results for input(s): LABPT, INR in the last 72 hours. No results for input(s): LABURIN in the last 72 hours. Results for orders placed or performed in visit on 11/27/12  Urine culture     Status: None   Collection Time: 11/27/12 11:19 AM  Result Value Ref Range Status   Culture ESCHERICHIA COLI  Final   Colony Count 10,000 COLONIES/ML  Final   Organism ID, Bacteria ESCHERICHIA COLI  Final      Susceptibility   Escherichia coli -  (no method available)    AMPICILLIN <=2 Sensitive     AMPICILLIN/SULBACTAM <=2 Sensitive     PIP/TAZO <=4 Sensitive     IMIPENEM <=0.25 Sensitive     CEFAZOLIN <=4 Sensitive     CEFOXITIN <=4 Sensitive     CEFTRIAXONE <=1 Sensitive     CEFTAZIDIME <=1 Sensitive     CEFEPIME <=1 Sensitive     GENTAMICIN <=1 Sensitive      TOBRAMYCIN <=1 Sensitive     CIPROFLOXACIN <=0.25 Sensitive     LEVOFLOXACIN <=0.12 Sensitive     NITROFURANTOIN <=16 Sensitive     TRIMETH/SULFA <=20 Sensitive     Imaging: I have independently reviewed the patient's CT scan demonstrating 2 small stones in the right proximal ureter with associated hydronephrosis.  Imp: Right proximal ureteral stones, pain poorly controlled.  Recommendations: I recommended that we proceed to the operating room for stent placement given her poor pain control. I discussed the procedure for the patient in quite some detail including the risk and benefits thereof. She understands that following this procedure she will hasn't postoperative stent discomfort. I also explained to her that she will need a second procedure to remove the stones. At that time, we could also remove the nonobstructing stone that is also in her right kidney.  Louis Meckel W

## 2015-09-14 NOTE — Anesthesia Postprocedure Evaluation (Signed)
Anesthesia Post Note  Patient: Megan Mcintosh  Procedure(s) Performed: Procedure(s) (LRB): CYSTOSCOPY WITH STENT PLACEMENT (Right)  Patient location during evaluation: PACU Anesthesia Type: General Level of consciousness: awake and alert Pain management: pain level controlled Vital Signs Assessment: post-procedure vital signs reviewed and stable Respiratory status: spontaneous breathing, nonlabored ventilation, respiratory function stable and patient connected to nasal cannula oxygen Cardiovascular status: blood pressure returned to baseline and stable Postop Assessment: no signs of nausea or vomiting Anesthetic complications: no    Last Vitals:  Filed Vitals:   09/12/15 0705 09/12/15 0710  BP: 132/36   Pulse:  76  Temp:    Resp:  20    Last Pain:  Filed Vitals:   09/12/15 0718  PainSc: 4                  Martha Clan

## 2015-09-18 ENCOUNTER — Ambulatory Visit: Payer: Self-pay

## 2015-09-18 ENCOUNTER — Telehealth: Payer: Self-pay | Admitting: Urology

## 2015-09-18 ENCOUNTER — Encounter: Payer: Self-pay | Admitting: Urology

## 2015-09-18 ENCOUNTER — Telehealth: Payer: Self-pay | Admitting: Radiology

## 2015-09-18 ENCOUNTER — Ambulatory Visit (INDEPENDENT_AMBULATORY_CARE_PROVIDER_SITE_OTHER): Payer: BLUE CROSS/BLUE SHIELD | Admitting: Urology

## 2015-09-18 VITALS — BP 143/78 | HR 71 | Temp 97.5°F | Resp 16 | Ht 60.75 in | Wt 96.9 lb

## 2015-09-18 DIAGNOSIS — N201 Calculus of ureter: Secondary | ICD-10-CM

## 2015-09-18 DIAGNOSIS — N200 Calculus of kidney: Secondary | ICD-10-CM

## 2015-09-18 DIAGNOSIS — Q433 Congenital malformations of intestinal fixation: Secondary | ICD-10-CM | POA: Diagnosis not present

## 2015-09-18 LAB — URINALYSIS, COMPLETE
BILIRUBIN UA: NEGATIVE
Glucose, UA: NEGATIVE
KETONES UA: NEGATIVE
Nitrite, UA: NEGATIVE
PH UA: 7.5 (ref 5.0–7.5)
SPEC GRAV UA: 1.02 (ref 1.005–1.030)
UUROB: 0.2 mg/dL (ref 0.2–1.0)

## 2015-09-18 LAB — MICROSCOPIC EXAMINATION
Bacteria, UA: NONE SEEN
Epithelial Cells (non renal): NONE SEEN /hpf (ref 0–10)
RBC, UA: >30 /hpf — AB (ref 0–?)
WBC UA: NONE SEEN /HPF (ref 0–?)

## 2015-09-18 NOTE — Telephone Encounter (Signed)
Megan Mcintosh called saying when she saw Dr. Erlene Quan, she was told she needs to be on an antibiotic for an infection she has. She's unsure if an antibiotic was prescribed for her if Dr. Erlene Quan wants her to wait until after her Lithotripsy to be placed on one. She'd like a phone call regarding this.  Pt's ph# [phone] Thank you.

## 2015-09-18 NOTE — Telephone Encounter (Signed)
Notified pt of surgery scheduled 09/29/15, pre-admit testing phone interview on 09/23/15 between 1-5pm & to call Friday prior to surgery for arrival time to SDS. Pt voices understanding.

## 2015-09-18 NOTE — Progress Notes (Signed)
09/18/2015 9:42 AM   Megan Mcintosh 09-Oct-1955 [id number]  Referring provider: [name], MD [address] [place], [place] [address]  Chief Complaint  Patient presents with  . New Patient (Initial Visit)  . Nephrolithiasis    HPI: 60 year old female who presented to the emergency room on 09/12/2015 with severe right flank pain. She was diagnosed with 2 proximal obstructing right ureteral stones at the level of the UPJ measuring 7 mm together.  Inidentally, her kidney was noted to be somewhat malrotated and inferiorly displaced.  She does also have some bilateral nonobstructing stones as well.  No leukocytosis or fevers, but did have too numerous to count red blood cells and white blood cells in her UA. No urine culture was sent.  She was taken the operating room for cystoscopy, right ureteral stent placement by Dr. Louis Meckel for the purpose of pain control.  Today, she returns to the office to discuss definitive management of her stone.  She has a remote stone history, previously passed 2 stones greater than 30 years ago. She does have a strong family history of kidney stones.  She is extremely healthy and at the fitness person. She recently took up hot yoga which she performs regularly since the beginning of the gear. She is worried that she is becoming dehydrated during these workouts.  Today, she has no significant complaints.  She does have some ressure from her stent but otherwise is tolerating it well. She does have some mild hematuria especially when she is more active. She continues to exercise.   PMH: Past Medical History  Diagnosis Date  . Pinguecula of right eye     removed by Amado Nash  . Nephrolithiasis 1987  . Cervical dysplasia 1980    s/p freezing  . Arthritis     Surgical History: Past Surgical History  Procedure Laterality Date  . Breast surgery  JAN 2012    right breast , BENIGN Rochel Brome  . Foot surgery  2006    [facility]:   silicone implants in both great toes   . Tubal ligation  1990  . Cesarean section      x 2  . Pilonidal  1979    cyst excised   . Breast biopsy Right 2012  . Cystoscopy with stent placement Right 09/12/2015    Procedure: CYSTOSCOPY WITH STENT PLACEMENT;  Surgeon: Ardis Hughs, MD;  Location: [facility] ORS;  Service: Urology;  Laterality: Right;    Home Medications:    Medication List       This list is accurate as of: 09/18/15  9:42 AM.  Always use your most recent med list.               Calcium Carbonate-Vitamin D 600-200 MG-UNIT Tabs  Take by mouth.     phenazopyridine 200 MG tablet  Commonly known as:  PYRIDIUM  Take 1 tablet (200 mg total) by mouth 3 (three) times daily as needed for pain.     traMADol 50 MG tablet  Commonly known as:  ULTRAM  Take 1-2 tablets (50-100 mg total) by mouth every 6 (six) hours as needed for moderate pain.     Trospium Chloride 60 MG Cp24  Take 1 capsule (60 mg total) by mouth daily.        Allergies: No Known Allergies  Family History: Family History  Problem Relation Age of Onset  . Aneurysm Mother 1    Hemorrhage CVA  . Stroke Mother   .  Urolithiasis Mother   . Kidney disease Mother   . Birth defects Maternal Uncle   . Cancer Neg Hx   . Prostate cancer Neg Hx   . Kidney cancer Neg Hx     Social History:  reports that she has never smoked. She has never used smokeless tobacco. She reports that she drinks alcohol. She reports that she does not use illicit drugs.  ROS: UROLOGY Frequent Urination?: No Hard to postpone urination?: No Burning/pain with urination?: No Get up at night to urinate?: No Leakage of urine?: No Urine stream starts and stops?: No Trouble starting stream?: No Do you have to strain to urinate?: No Blood in urine?: Yes Urinary tract infection?: No Sexually transmitted disease?: No Injury to kidneys or bladder?: Yes Painful intercourse?: No Weak stream?: No Currently pregnant?: No Vaginal  bleeding?: No Last menstrual period?: n  Gastrointestinal Nausea?: Yes Vomiting?: No Indigestion/heartburn?: No Diarrhea?: No Constipation?: No  Constitutional Fever: No Night sweats?: No Weight loss?: No Fatigue?: No  Skin Skin rash/lesions?: No Itching?: No  Eyes Blurred vision?: No Double vision?: No  Ears/Nose/Throat Sore throat?: No Sinus problems?: No  Hematologic/Lymphatic Swollen glands?: No Easy bruising?: No  Cardiovascular Leg swelling?: No Chest pain?: No  Respiratory Cough?: No Shortness of breath?: No  Endocrine Excessive thirst?: No  Musculoskeletal Back pain?: No Joint pain?: No  Neurological Headaches?: No Dizziness?: No  Psychologic Depression?: No Anxiety?: No  Physical Exam: BP 143/78 mmHg  Pulse 71  Temp(Src) 97.5 F (36.4 C) (Oral)  Resp 16  Ht 5' 0.75" (1.543 m)  Wt 96 lb 14.4 oz (43.954 kg)  BMI 18.46 kg/m2  Constitutional:  Alert and oriented, No acute distress. HEENT: [place] AT, moist mucus membranes.  Trachea midline, no masses. CTAB. Cardiovascular: No clubbing, cyanosis, or edema. RRR. Respiratory: Normal respiratory effort, no increased work of breathing. GI: Abdomen is soft, nontender, nondistended, no abdominal masses GU: No CVA tenderness.  Skin: No rashes, bruises or suspicious lesions. Neurologic: Grossly intact, no focal deficits, moving all 4 extremities. Psychiatric: Normal mood and affect.  Laboratory Data: Lab Results  Component Value Date   WBC 7.3 09/12/2015   HGB 14.7 09/12/2015   HCT 43.0 09/12/2015   MCV 87.6 09/12/2015   PLT 396 09/12/2015    Lab Results  Component Value Date   CREATININE 0.80 09/12/2015    Urinalysis Results for orders placed or performed in visit on 09/18/15  Microscopic Examination  Result Value Ref Range   WBC, UA None seen 0 -  5 /hpf   RBC, UA >30 (A) 0 -  2 /hpf   Epithelial Cells (non renal) None seen 0 - 10 /hpf   Bacteria, UA None seen None seen/Few    Urinalysis, Complete  Result Value Ref Range   Specific Gravity, UA 1.020 1.005 - 1.030   pH, UA 7.5 5.0 - 7.5   Color, UA Amber (A) Yellow   Appearance Ur Cloudy (A) Clear   Leukocytes, UA 1+ (A) Negative   Protein, UA 2+ (A) Negative/Trace   Glucose, UA Negative Negative   Ketones, UA Negative Negative   RBC, UA 3+ (A) Negative   Bilirubin, UA Negative Negative   Urobilinogen, Ur 0.2 0.2 - 1.0 mg/dL   Nitrite, UA Negative Negative   Microscopic Examination See below:     Pertinent Imaging: Study Result     CLINICAL DATA: Right flank pain for 30 minutes. History of kidney stones.  EXAM: CT ABDOMEN AND PELVIS WITHOUT CONTRAST  TECHNIQUE: Multidetector CT imaging of the abdomen and pelvis was performed following the standard protocol without IV contrast.  COMPARISON: 11/27/2012  FINDINGS: The lung bases are clear.  Pelvic location of the right kidney. The kidney appears to have rotated horizontally and inferiorly since the previous study. There are multiple bilateral intrarenal stones. Largest is in the lower pole of the left kidney and measures about 5 mm in diameter. There are 2 stones in the right renal pelvis at the ureteropelvic junction, measuring together about 7 mm. There is moderate hydronephrosis of the right kidney. Ureters are decompressed and no additional ureteral stones are demonstrated. Bladder is decompressed.  The unenhanced appearance of the liver, spleen, gallbladder, pancreas, adrenal glands, abdominal aorta, inferior vena cava, and retroperitoneal lymph nodes is unremarkable. Stomach, small bowel, and colon are not abnormally distended. Stool fills the colon. No free air or free fluid in the abdomen.  Pelvis: Uterus and ovaries are not enlarged. No free or loculated pelvic fluid collections. No pelvic mass or lymphadenopathy. Degenerative changes in the spine and hips. No destructive bone lesions.  IMPRESSION: Two stones in  the right ureteropelvic junction with moderate proximal obstruction. Multiple bilateral nonobstructing intrarenal stones. The kidney appears to have rotated towards the pelvis since the previous study.   Electronically Signed  By: Lucienne Capers M.D.  On: 09/12/2015 02:28   CT scan personally reviewed and with the patient.  Assessment & Plan:    1. Right UPJ stone s/p stent We discussed definitive management of her stones.  We discussed various treatment options including ESWL vs. ureteroscopy, laser lithotripsy, and stent. We discussed the risks and benefits of both including bleeding, infection, damage to surrounding structures, efficacy with need for possible further intervention, and need for temporary ureteral stent.  Given multiple nonobstructing stones on the right, I would recommend ureteroscopy to attempt to clear her of her stone burden on the side. She may eventually need treatment for her left side as well given the size of these nonobstructing stones.  Her questions were answered. She has agreed to proceed as planned.  Preop urine culture  2. Metabolic stone diease We spent some time today discussing metabolic stone disease and stone prevention techniques.  A clean need a 123456 urine metabolic workup once she is cleared of her stone burden.    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 Increased risk for stone formation.  [name], MD  [facility] [address], [place], [place] [address] [phone]

## 2015-09-22 ENCOUNTER — Ambulatory Visit: Payer: Self-pay

## 2015-09-22 LAB — CULTURE, URINE COMPREHENSIVE

## 2015-09-23 ENCOUNTER — Other Ambulatory Visit: Payer: BLUE CROSS/BLUE SHIELD

## 2015-09-23 ENCOUNTER — Encounter: Payer: Self-pay | Admitting: *Deleted

## 2015-09-23 NOTE — Patient Instructions (Signed)
  Your procedure is scheduled on: 09-29-15 Report to [place] To find out your arrival time please call [phone] between 1PM - 3PM on 09-26-15  Remember: Instructions that are not followed completely may result in serious medical risk, up to and including death, or upon the discretion of your surgeon and anesthesiologist your surgery may need to be rescheduled.    _X___ 1. Do not eat food or drink liquids after midnight. No gum chewing or hard candies.     _X___ 2. No Alcohol for 24 hours before or after surgery.   ____ 3. Bring all medications with you on the day of surgery if instructed.    ____ 4. Notify your doctor if there is any change in your medical condition     (cold, fever, infections).     Do not wear jewelry, make-up, hairpins, clips or nail polish.  Do not wear lotions, powders, or perfumes. You may wear deodorant.  Do not shave 48 hours prior to surgery. Men may shave face and neck.  Do not bring valuables to the hospital.    [facility] is not responsible for any belongings or valuables.               Contacts, dentures or bridgework may not be worn into surgery.  Leave your suitcase in the car. After surgery it may be brought to your room.  For patients admitted to the hospital, discharge time is determined by your treatment team.   Patients discharged the day of surgery will not be allowed to drive home.   Please read over the following fact sheets that you were given:      ____ Take these medicines the morning of surgery with A SIP OF WATER:    1.NONE  2.   3.   4.  5.  6.  ____ Fleet Enema (as directed)   ____ Use CHG Soap as directed  ____ Use inhalers on the day of surgery  ____ Stop metformin 2 days prior to surgery    ____ Take 1/2 of usual insulin dose the night before surgery and none on the morning of surgery.   ____ Stop Coumadin/Plavix/aspirin-N/A   _X___ Stop Anti-inflammatories-NO NSAIDS OR ASA  PRODUCTS-TYLENOL OK TO CONTINUE   ____ Stop supplements until after surgery.    ____ Bring C-Pap to the hospital.

## 2015-09-23 NOTE — Telephone Encounter (Signed)
Spoke with pt in reference to abx. Made aware ucx was negative. Pt stated she spoke with Amy earlier.

## 2015-09-29 ENCOUNTER — Ambulatory Visit
Admission: RE | Admit: 2015-09-29 | Discharge: 2015-09-29 | Disposition: A | Discharge status: Home / Self Care | Payer: BLUE CROSS/BLUE SHIELD | Source: Ambulatory Visit | Attending: Urology | Admitting: Urology

## 2015-09-29 ENCOUNTER — Ambulatory Visit: Payer: BLUE CROSS/BLUE SHIELD | Admitting: Anesthesiology

## 2015-09-29 ENCOUNTER — Encounter
Admission: RE | Disposition: A | Discharge status: Home / Self Care | Payer: Self-pay | Source: Ambulatory Visit | Attending: Urology

## 2015-09-29 ENCOUNTER — Encounter: Payer: Self-pay | Admitting: *Deleted

## 2015-09-29 DIAGNOSIS — N2889 Other specified disorders of kidney and ureter: Secondary | ICD-10-CM | POA: Diagnosis not present

## 2015-09-29 DIAGNOSIS — Z8249 Family history of ischemic heart disease and other diseases of the circulatory system: Secondary | ICD-10-CM | POA: Diagnosis not present

## 2015-09-29 DIAGNOSIS — Z823 Family history of stroke: Secondary | ICD-10-CM | POA: Insufficient documentation

## 2015-09-29 DIAGNOSIS — N200 Calculus of kidney: Secondary | ICD-10-CM

## 2015-09-29 DIAGNOSIS — Z841 Family history of disorders of kidney and ureter: Secondary | ICD-10-CM | POA: Insufficient documentation

## 2015-09-29 DIAGNOSIS — Z87442 Personal history of urinary calculi: Secondary | ICD-10-CM | POA: Insufficient documentation

## 2015-09-29 DIAGNOSIS — M1990 Unspecified osteoarthritis, unspecified site: Secondary | ICD-10-CM | POA: Diagnosis not present

## 2015-09-29 DIAGNOSIS — N202 Calculus of kidney with calculus of ureter: Secondary | ICD-10-CM | POA: Insufficient documentation

## 2015-09-29 DIAGNOSIS — Z79899 Other long term (current) drug therapy: Secondary | ICD-10-CM | POA: Insufficient documentation

## 2015-09-29 HISTORY — PX: CYSTOSCOPY/URETEROSCOPY/HOLMIUM LASER/STENT PLACEMENT: SHX6546

## 2015-09-29 HISTORY — PX: STONE EXTRACTION WITH BASKET: SHX5318

## 2015-09-29 SURGERY — CYSTOSCOPY/URETEROSCOPY/HOLMIUM LASER/STENT PLACEMENT
Anesthesia: General | Site: Ureter | Laterality: Right | Wound class: Clean Contaminated

## 2015-09-29 MED ORDER — HYDROCODONE-ACETAMINOPHEN 5-325 MG PO TABS
ORAL_TABLET | ORAL | Status: AC
  Filled 2015-09-29: qty 1

## 2015-09-29 MED ORDER — OXYBUTYNIN CHLORIDE 5 MG PO TABS: 5.0000 mg | ORAL_TABLET | Freq: Three times a day (TID) | ORAL | Status: DC | PRN

## 2015-09-29 MED ORDER — SUCCINYLCHOLINE CHLORIDE 20 MG/ML IJ SOLN
INTRAMUSCULAR | Status: DC | PRN
  Administered 2015-09-29: 100 mg via INTRAVENOUS

## 2015-09-29 MED ORDER — MIDAZOLAM HCL 2 MG/2ML IJ SOLN
INTRAMUSCULAR | Status: DC | PRN
  Administered 2015-09-29: 2 mg via INTRAVENOUS

## 2015-09-29 MED ORDER — GENTAMICIN SULFATE 40 MG/ML IJ SOLN
80.0000 mg | Freq: Once | INTRAVENOUS | Status: DC
  Filled 2015-09-29: qty 2

## 2015-09-29 MED ORDER — IOTHALAMATE MEGLUMINE 43 % IV SOLN
INTRAVENOUS | Status: DC | PRN
  Administered 2015-09-29: 10 mL via URETHRAL

## 2015-09-29 MED ORDER — SUGAMMADEX SODIUM 200 MG/2ML IV SOLN
INTRAVENOUS | Status: DC | PRN
  Administered 2015-09-29: 89 mg via INTRAVENOUS

## 2015-09-29 MED ORDER — AMPICILLIN SODIUM 1 G IJ SOLR
INTRAMUSCULAR | Status: DC
Start: 2015-09-29 — End: 2015-09-29
  Filled 2015-09-29: qty 1000

## 2015-09-29 MED ORDER — OXYCODONE HCL 5 MG PO TABS: 5.0000 mg | ORAL_TABLET | Freq: Once | ORAL | Status: DC | PRN

## 2015-09-29 MED ORDER — ROCURONIUM BROMIDE 100 MG/10ML IV SOLN
INTRAVENOUS | Status: DC | PRN
  Administered 2015-09-29: 45 mg via INTRAVENOUS
  Administered 2015-09-29: 5 mg via INTRAVENOUS

## 2015-09-29 MED ORDER — LACTATED RINGERS IV SOLN
INTRAVENOUS | Status: DC
  Administered 2015-09-29 (×2): via INTRAVENOUS

## 2015-09-29 MED ORDER — PROPOFOL 10 MG/ML IV BOLUS
INTRAVENOUS | Status: DC | PRN
  Administered 2015-09-29: 130 mg via INTRAVENOUS

## 2015-09-29 MED ORDER — HYDROCODONE-ACETAMINOPHEN 5-325 MG PO TABS: 1.0000 | ORAL_TABLET | Freq: Four times a day (QID) | ORAL | Status: DC | PRN

## 2015-09-29 MED ORDER — GENTAMICIN IN SALINE 1.6-0.9 MG/ML-% IV SOLN
80.0000 mg | Freq: Once | INTRAVENOUS | Status: AC
  Administered 2015-09-29: 80 mg via INTRAVENOUS
  Filled 2015-09-29: qty 50

## 2015-09-29 MED ORDER — TAMSULOSIN HCL 0.4 MG PO CAPS: 0.4000 mg | ORAL_CAPSULE | Freq: Every day | ORAL | Status: DC

## 2015-09-29 MED ORDER — DEXAMETHASONE SODIUM PHOSPHATE 10 MG/ML IJ SOLN
INTRAMUSCULAR | Status: DC | PRN
  Administered 2015-09-29: 10 mg via INTRAVENOUS

## 2015-09-29 MED ORDER — DOCUSATE SODIUM 100 MG PO CAPS: 100.0000 mg | ORAL_CAPSULE | Freq: Two times a day (BID) | ORAL | Status: DC

## 2015-09-29 MED ORDER — SODIUM CHLORIDE 0.9 % IV SOLN
1.0000 g | Freq: Once | INTRAVENOUS | Status: AC
  Administered 2015-09-29: 1 g via INTRAVENOUS

## 2015-09-29 MED ORDER — FAMOTIDINE 20 MG PO TABS
ORAL_TABLET | ORAL | Status: AC
  Administered 2015-09-29: 20 mg
  Filled 2015-09-29: qty 1

## 2015-09-29 MED ORDER — ONDANSETRON HCL 4 MG/2ML IJ SOLN
INTRAMUSCULAR | Status: DC | PRN
  Administered 2015-09-29: 4 mg via INTRAVENOUS

## 2015-09-29 MED ORDER — LIDOCAINE HCL (CARDIAC) 20 MG/ML IV SOLN
INTRAVENOUS | Status: DC | PRN
  Administered 2015-09-29: 100 mg via INTRAVENOUS

## 2015-09-29 MED ORDER — FAMOTIDINE 20 MG PO TABS: 20.0000 mg | ORAL_TABLET | Freq: Once | ORAL | Status: DC

## 2015-09-29 MED ORDER — FENTANYL CITRATE (PF) 100 MCG/2ML IJ SOLN: 25.0000 ug | INTRAMUSCULAR | Status: DC | PRN

## 2015-09-29 MED ORDER — PHENYLEPHRINE HCL 10 MG/ML IJ SOLN
INTRAMUSCULAR | Status: DC | PRN
Start: 2015-09-29 — End: 2015-09-29
  Administered 2015-09-29: 100 ug via INTRAVENOUS

## 2015-09-29 MED ORDER — FENTANYL CITRATE (PF) 100 MCG/2ML IJ SOLN
INTRAMUSCULAR | Status: DC | PRN
  Administered 2015-09-29: 100 ug via INTRAVENOUS

## 2015-09-29 MED ORDER — OXYCODONE HCL 5 MG/5ML PO SOLN: 5.0000 mg | Freq: Once | ORAL | Status: DC | PRN

## 2015-09-29 SURGICAL SUPPLY — 32 items
ADAPTER SCOPE UROLOK II (MISCELLANEOUS) IMPLANT
ADHESIVE MASTISOL STRL (MISCELLANEOUS) ×3 IMPLANT
BAG DRAIN CYSTO-URO LG1000N (MISCELLANEOUS) ×3 IMPLANT
BASKET ZERO TIP 1.9FR (BASKET) ×3 IMPLANT
CATH URETL 5X70 OPEN END (CATHETERS) ×3 IMPLANT
CNTNR SPEC 2.5X3XGRAD LEK (MISCELLANEOUS) ×1
CONRAY 43 FOR UROLOGY 50M (MISCELLANEOUS) ×3 IMPLANT
CONT SPEC 4OZ STER OR WHT (MISCELLANEOUS) ×2
CONTAINER SPEC 2.5X3XGRAD LEK (MISCELLANEOUS) ×1 IMPLANT
DRAPE UTILITY 15X26 TOWEL STRL (DRAPES) ×3 IMPLANT
DRSG TEGADERM 2X2.25 PEDS (GAUZE/BANDAGES/DRESSINGS) ×3 IMPLANT
FIBER LASER LITHO 200 (Laser) ×3 IMPLANT
GLOVE BIO SURGEON STRL SZ 6.5 (GLOVE) ×2 IMPLANT
GLOVE BIO SURGEONS STRL SZ 6.5 (GLOVE) ×1
GOWN STRL REUS W/ TWL LRG LVL3 (GOWN DISPOSABLE) ×2 IMPLANT
GOWN STRL REUS W/TWL LRG LVL3 (GOWN DISPOSABLE) ×4
INTRODUCER DILATOR DOUBLE (INTRODUCER) IMPLANT
KIT RM TURNOVER CYSTO AR (KITS) ×3 IMPLANT
PACK CYSTO AR (MISCELLANEOUS) ×3 IMPLANT
PREP PVP WINGED SPONGE (MISCELLANEOUS) ×3 IMPLANT
PROFLEX200 ×3 IMPLANT
PUMP SINGLE ACTION SAP (PUMP) IMPLANT
SENSORWIRE 0.038 NOT ANGLED (WIRE) ×3
SET CYSTO W/LG BORE CLAMP LF (SET/KITS/TRAYS/PACK) ×3 IMPLANT
SHEATH URETERAL 12FRX35CM (MISCELLANEOUS) IMPLANT
SOL .9 NS 3000ML IRR  AL (IV SOLUTION) ×2
SOL .9 NS 3000ML IRR UROMATIC (IV SOLUTION) ×1 IMPLANT
STENT URET 6FRX24 CONTOUR (STENTS) IMPLANT
STENT URET 6FRX26 CONTOUR (STENTS) IMPLANT
SURGILUBE 2OZ TUBE FLIPTOP (MISCELLANEOUS) ×3 IMPLANT
WATER STERILE IRR 1000ML POUR (IV SOLUTION) ×3 IMPLANT
WIRE SENSOR 0.038 NOT ANGLED (WIRE) ×1 IMPLANT

## 2015-09-29 NOTE — Discharge Instructions (Signed)
You have a ureteral stent in place.  This is a tube that extends from your kidney to your bladder.  This may cause urinary bleeding, burning with urination, and urinary frequency.  Please call our office or present to the ED if you develop fevers >101 or pain which is not able to be controlled with oral pain medications.  You may be given either Flomax and/ or ditropan to help with bladder spasms and stent pain in addition to pain medications.    Your stent is attached to a string.  You may pull this stent in 3 days by pulling the string and removing the stent in entirety.    [facility] [address], [place], [place] [address] [phone]    AMBULATORY SURGERY  DISCHARGE INSTRUCTIONS   1) The drugs that you were given will stay in your system until tomorrow so for the next 24 hours you should not:  A) Drive an automobile B) Make any legal decisions C) Drink any alcoholic beverage   2) You may resume regular meals tomorrow.  Today it is better to start with liquids and gradually work up to solid foods.  You may eat anything you prefer, but it is better to start with liquids, then soup and crackers, and gradually work up to solid foods.   3) Please notify your doctor immediately if you have any unusual bleeding, trouble breathing, redness and pain at the surgery site, drainage, fever, or pain not relieved by medication.    4) Additional Instructions:        Please contact your physician with any problems or Same Day Surgery at [phone], Monday through Friday 6 am to 4 pm, or [place] [facility] number at [phone].

## 2015-09-29 NOTE — Op Note (Signed)
Date of procedure: 09/29/2015  Preoperative diagnosis:  1. Right obstructing ureteral calculi 2. Right nonobstructing kidney stones  Postoperative diagnosis:  1. Same as above   Procedure: 3. Right ureteroscopy 4. Laser lithotripsy 5. Basket extraction of Stone fragment 6. Right ureteral stent exchange  7. Right retrograde pyelogram  Surgeon: [name], MD  Anesthesia: General  Complications: None  Intraoperative findings: Normal right retrograde pyelogram. 3 stones within the collecting system obliterated. Small fragments sent for stone analysis.  EBL: Minimal  Specimens: Stone fragments  Drains: 6 x 22 French double-J ureteral stent on right  Indication: Megan Mcintosh is a 60 y.o. patient with previously obstructing right proximal ureteral stone who underwent urgent ureteral stent placement for pain control. She returns today for definitive management of her proximal ureteral stone as well as treatment of her nonobstructing right-sided stones..  After reviewing the management options for treatment, she elected to proceed with the above surgical procedure(s). We have discussed the potential benefits and risks of the procedure, side effects of the proposed treatment, the likelihood of the patient achieving the goals of the procedure, and any potential problems that might occur during the procedure or recuperation. Informed consent has been obtained.  Description of procedure:  The patient was taken to the operating room and general anesthesia was induced.  The patient was placed in the dorsal lithotomy position, prepped and draped in the usual sterile fashion, and preoperative antibiotics were administered. A preoperative time-out was performed.   A rigid 21 [place] scope was advanced per urethra into the bladder. Attention was turned to the right ureteral orifice from which a ureteral stent was seen emanating. The distal coil of the stent was grasped and brought to the level  of the urethral meatus. The stent was then cannulated using a sensor wire up to level of the kidney leaving the wire place and removing the old stent which was passed off the field. At this point in time, the sensor wire was snapped in place as a safety wire. A semirigid 4.5 French needle ureteroscope was advanced alongside the wire up to level of the very proximal ureter at which time no stone was seen. A second sensor wire was advanced through the scope up to level of the kidney. A flexible dual lumen ureteroscope advanced easily over the second sensor wire up to level of the kidney. At this point time, a formal pyeloscopy was performed visualization each never calyx. A larger stone was seen in the midpole calyx presumably representing the former proximal ureteral stone which had been pushed into the upper tract at the time of stent placement. There are also 2 additional nonobstructing smaller calculi noted. A 200 . Laser fiber was then brought in and using settings varying throughout the case of an initially 1 J and 15 Hz, later to 0.2 J and 40 Hz, the stones were fragmented into very fine dust like particles. One of the larger fragments was extracted using a 1.9 [place] to plus nitinol basket. The scope was reintroduced several times bringing out the larger of the fragments upon each scope passage using the aforementioned technique with a safety wire. Once the kidney was adequately cleared of stone debris, the scope was backed to level of the proximal ureter. A retrograde pyelogram was performed through the scope which revealed a normal upper tract collecting system without any obvious filling defects or hydronephrosis. The collecting system was quite simple without any obvious drooping or significant rotation appreciable.  The scope was  then backed down the length of the ureter carefully inspecting along the way. There was no ureteral trauma, injury, or obstructing stone fragments identified. A 6 x 22 French  double-J ureteral stent was advanced over the procedure were up to level of the renal pelvis. The wire was then partially drawn until a coil was seen hooking over an upper pole calyx. The wire was then fully withdrawn until full coil was noted within the bladder. The bladder was then drained. The patient was then cleaned and dried. The stent string was attached the patient's left inner thigh using Mastisol and Tegaderm. She was then repositioned supine position, reversed from anesthesia, taken to the PACU in stable condition.  Plan: Patient will follow-up in 4 weeks with renal ultrasound prior to discuss her stone analysis as well as consideration of definitive management of her for her left-sided nonobstructing stones. We will also discussed stone prevention at that time as well.  She will remove her own stent in a few days.  [name], M.D.

## 2015-09-29 NOTE — Anesthesia Preprocedure Evaluation (Signed)
Anesthesia Evaluation  Patient identified by MRN, date of birth, ID band Patient awake    Reviewed: Allergy & Precautions, H&P , NPO status , Patient's Chart, lab work & pertinent test results  History of Anesthesia Complications Negative for: history of anesthetic complications  Airway Mallampati: II  TM Distance: >3 FB Neck ROM: limited    Dental  (+) Teeth Intact   Pulmonary neg pulmonary ROS, neg shortness of breath,    Pulmonary exam normal breath sounds clear to auscultation       Cardiovascular Exercise Tolerance: Good (-) angina(-) Past MI and (-) DOE negative cardio ROS Normal cardiovascular exam Rhythm:regular Rate:Normal     Neuro/Psych negative neurological ROS  negative psych ROS   GI/Hepatic negative GI ROS, Neg liver ROS, neg GERD  ,  Endo/Other  negative endocrine ROS  Renal/GU Renal disease  negative genitourinary   Musculoskeletal  (+) Arthritis ,   Abdominal   Peds  Hematology negative hematology ROS (+)   Anesthesia Other Findings Past Medical History:   Pinguecula of right eye                                        Comment:removed by Amado Nash   Nephrolithiasis                                 1987         Cervical dysplasia                              1980           Comment:s/p freezing   Arthritis                                                   Past Surgical History:   BREAST SURGERY                                   JAN 2012       Comment:right breast , [place]   FOOT SURGERY                                     2006           Comment:Todd Hy[facility]:  silicone implants in both great               toes    [place]                                                Comment:x 2   pilonidal  1979           Comment:cyst excised    BREAST BIOPSY                                    Right 2012         CYSTOSCOPY WITH STENT PLACEMENT                 Right 09/12/2015      Comment:Procedure: CYSTOSCOPY WITH STENT PLACEMENT;                Surgeon: Ardis Hughs, MD;  Location:               [facility] ORS;  Service: Urology;  Laterality:               Right;  BMI    Body Mass Index   18.67 kg/m 2      Reproductive/Obstetrics negative OB ROS                             Anesthesia Physical Anesthesia Plan  ASA: II  Anesthesia Plan: General ETT   Post-op Pain Management:    Induction:   Airway Management Planned:   Additional Equipment:   Intra-op Plan:   Post-operative Plan:   Informed Consent: I have reviewed the patients History and Physical, chart, labs and discussed the procedure including the risks, benefits and alternatives for the proposed anesthesia with the patient or authorized representative who has indicated his/her understanding and acceptance.   Dental Advisory Given  Plan Discussed with: Anesthesiologist, CRNA and Surgeon  Anesthesia Plan Comments:         Anesthesia Quick Evaluation

## 2015-09-29 NOTE — Progress Notes (Signed)
Will start ampicillin on call, Gentamycin to be given in OR - Denna Haggard, RN aware of same

## 2015-09-29 NOTE — Interval H&P Note (Signed)
History and Physical Interval Note:  09/29/2015 1:25 PM  Megan Mcintosh  has presented today for surgery, with the diagnosis of RIGHT URETERAL STONE  The various methods of treatment have been discussed with the patient and family. After consideration of risks, benefits and other options for treatment, the patient has consented to  Procedure(s): CYSTOSCOPY/URETEROSCOPY/HOLMIUM LASER/STENT PLACEMENT/ STENT EXCHANGE (Right) as a surgical intervention .  The patient's history has been reviewed, patient examined, no change in status, stable for surgery.  I have reviewed the patient's chart and labs.  Questions were answered to the patient's satisfaction.    RRR CTAB  [name]

## 2015-09-29 NOTE — Transfer of Care (Signed)
Immediate Anesthesia Transfer of Care Note  Patient: Megan Mcintosh  Procedure(s) Performed: Procedure(s): CYSTOSCOPY/URETEROSCOPY/HOLMIUM LASER/STENT PLACEMENT/ STENT EXCHANGE (Right) STONE EXTRACTION WITH BASKET (Right)  Patient Location: PACU  Anesthesia Type:General  Level of Consciousness: sedated  Airway & Oxygen Therapy: Patient Spontanous Breathing and Patient connected to face mask oxygen  Post-op Assessment: Report given to RN and Post -op Vital signs reviewed and stable  Post vital signs: Reviewed and stable  Last Vitals:  Filed Vitals:   09/29/15 1302  BP: 141/72  Pulse: 73  Temp: 36.5 C  Resp: 16    Last Pain:  Filed Vitals:   09/29/15 1305  PainSc: 2          Complications: No apparent anesthesia complications

## 2015-09-29 NOTE — H&P (View-Only) (Signed)
09/18/2015 9:42 AM   Megan Mcintosh 09-Oct-1955 [id number]  Referring provider: [name], MD [place],  [id number]  Chief Complaint  Patient presents with  . New Patient (Initial Visit)  . Nephrolithiasis    HPI: 60 year old female who presented to the emergency room on 09/12/2015 with severe right flank pain. She was diagnosed with 2 proximal obstructing right ureteral stones at the level of the UPJ measuring 7 mm together.  Inidentally, her kidney was noted to be somewhat malrotated and inferiorly displaced.  She does also have some bilateral nonobstructing stones as well.  No leukocytosis or fevers, but did have too numerous to count red blood cells and white blood cells in her UA. No urine culture was sent.  She was taken the operating room for cystoscopy, right ureteral stent placement by Dr. Louis Meckel for the purpose of pain control.  Today, she returns to the office to discuss definitive management of her stone.  She has a remote stone history, previously passed 2 stones greater than 30 years ago. She does have a strong family history of kidney stones.  She is extremely healthy and at the fitness person. She recently took up hot yoga which she performs regularly since the beginning of the gear. She is worried that she is becoming dehydrated during these workouts.  Today, she has no significant complaints.  She does have some ressure from her stent but otherwise is tolerating it well. She does have some mild hematuria especially when she is more active. She continues to exercise.   PMH: Past Medical History  Diagnosis Date  . Pinguecula of right eye     removed by Amado Nash  . Nephrolithiasis 1987  . Cervical dysplasia 1980    s/p freezing  . Arthritis     Surgical History: Past Surgical History  Procedure Laterality Date  . Breast surgery  JAN 2012    right breast , BENIGN Rochel Brome  . Foot surgery  2006    [facility]:   silicone implants in both great toes   . Tubal ligation  1990  . Cesarean section      x 2  . Pilonidal  1979    cyst excised   . Breast biopsy Right 2012  . Cystoscopy with stent placement Right 09/12/2015    Procedure: CYSTOSCOPY WITH STENT PLACEMENT;  Surgeon: Ardis Hughs, MD;  Location: [facility] OR[facility];  Service: Urology;  Laterality: Right;    Home Medications:    Medication List       This list is accurate as of: 09/18/15  9:42 AM.  Always use your most recent med list.               Calcium Carbonate-Vitamin D 600-200 MG-UNIT Tabs  Take by mouth.     phenazopyridine 200 MG tablet  Commonly known as:  PYRIDIUM  Take 1 tablet (200 mg total) by mouth 3 (three) times daily as needed for pain.     traMADol 50 MG tablet  Commonly known as:  ULTRAM  Take 1-2 tablets (50-100 mg total) by mouth every 6 (six) hours as needed for moderate pain.     Trospium Chloride 60 MG Cp24  Take 1 capsule (60 mg total) by mouth daily.        Allergies: No Known Allergies  Family History: Family History  Problem Relation Age of Onset  . Aneurysm Mother 1    Hemorrhage CVA  . Stroke Mother   .  Urolithiasis Mother   . Kidney disease Mother   . Birth defects Maternal Uncle   . Cancer Neg Hx   . Prostate cancer Neg Hx   . Kidney cancer Neg Hx     Social History:  reports that she has never smoked. She has never used smokeless tobacco. She reports that she drinks alcohol. She reports that she does not use illicit drugs.  ROS: UROLOGY Frequent Urination?: No Hard to postpone urination?: No Burning/pain with urination?: No Get up at night to urinate?: No Leakage of urine?: No Urine stream starts and stops?: No Trouble starting stream?: No Do you have to strain to urinate?: No Blood in urine?: Yes Urinary tract infection?: No Sexually transmitted disease?: No Injury to kidneys or bladder?: Yes Painful intercourse?: No Weak stream?: No Currently pregnant?: No Vaginal  bleeding?: No Last menstrual period?: n  Gastrointestinal Nausea?: Yes Vomiting?: No Indigestion/heartburn?: No Diarrhea?: No Constipation?: No  Constitutional Fever: No Night sweats?: No Weight loss?: No Fatigue?: No  Skin Skin rash/lesions?: No Itching?: No  Eyes Blurred vision?: No Double vision?: No  Ears/Nose/Throat Sore throat?: No Sinus problems?: No  Hematologic/Lymphatic Swollen glands?: No Easy bruising?: No  Cardiovascular Leg swelling?: No Chest pain?: No  Respiratory Cough?: No Shortness of breath?: No  Endocrine Excessive thirst?: No  Musculoskeletal Back pain?: No Joint pain?: No  Neurological Headaches?: No Dizziness?: No  Psychologic Depression?: No Anxiety?: No  Physical Exam: BP 143/78 mmHg  Pulse 71  Temp(Src) 97.5 F (36.4 C) (Oral)  Resp 16  Ht 5' 0.75" (1.543 m)  Wt 96 lb 14.4 oz (43.954 kg)  BMI 18.46 kg/m2  Constitutional:  Alert and oriented, No acute distress. HEENT: [place] AT, moist mucus membranes.  Trachea midline, no masses. CTAB. Cardiovascular: No clubbing, cyanosis, or edema. RRR. Respiratory: Normal respiratory effort, no increased work of breathing. GI: Abdomen is soft, nontender, nondistended, no abdominal masses GU: No CVA tenderness.  Skin: No rashes, bruises or suspicious lesions. Neurologic: Grossly intact, no focal deficits, moving all 4 extremities. Psychiatric: Normal mood and affect.  Laboratory Data: Lab Results  Component Value Date   WBC 7.3 09/12/2015   HGB 14.7 09/12/2015   HCT 43.0 09/12/2015   MCV 87.6 09/12/2015   PLT 396 09/12/2015    Lab Results  Component Value Date   CREATININE 0.80 09/12/2015    Urinalysis Results for orders placed or performed in visit on 09/18/15  Microscopic Examination  Result Value Ref Range   WBC, UA None seen 0 -  5 /hpf   RBC, UA >30 (A) 0 -  2 /hpf   Epithelial Cells (non renal) None seen 0 - 10 /hpf   Bacteria, UA None seen None seen/Few    Urinalysis, Complete  Result Value Ref Range   Specific Gravity, UA 1.020 1.005 - 1.030   pH, UA 7.5 5.0 - 7.5   Color, UA Amber (A) Yellow   Appearance Ur Cloudy (A) Clear   Leukocytes, UA 1+ (A) Negative   Protein, UA 2+ (A) Negative/Trace   Glucose, UA Negative Negative   Ketones, UA Negative Negative   RBC, UA 3+ (A) Negative   Bilirubin, UA Negative Negative   Urobilinogen, Ur 0.2 0.2 - 1.0 mg/dL   Nitrite, UA Negative Negative   Microscopic Examination See below:     Pertinent Imaging: Study Result     CLINICAL DATA: Right flank pain for 30 minutes. History of kidney stones.  EXAM: CT ABDOMEN AND PELVIS WITHOUT CONTRAST  TECHNIQUE: Multidetector CT imaging of the abdomen and pelvis was performed following the standard protocol without IV contrast.  COMPARISON: 11/27/2012  FINDINGS: The lung bases are clear.  Pelvic location of the right kidney. The kidney appears to have rotated horizontally and inferiorly since the previous study. There are multiple bilateral intrarenal stones. Largest is in the lower pole of the left kidney and measures about 5 mm in diameter. There are 2 stones in the right renal pelvis at the ureteropelvic junction, measuring together about 7 mm. There is moderate hydronephrosis of the right kidney. Ureters are decompressed and no additional ureteral stones are demonstrated. Bladder is decompressed.  The unenhanced appearance of the liver, spleen, gallbladder, pancreas, adrenal glands, abdominal aorta, inferior vena cava, and retroperitoneal lymph nodes is unremarkable. Stomach, small bowel, and colon are not abnormally distended. Stool fills the colon. No free air or free fluid in the abdomen.  Pelvis: Uterus and ovaries are not enlarged. No free or loculated pelvic fluid collections. No pelvic mass or lymphadenopathy. Degenerative changes in the spine and hips. No destructive bone lesions.  IMPRESSION: Two stones in  the right ureteropelvic junction with moderate proximal obstruction. Multiple bilateral nonobstructing intrarenal stones. The kidney appears to have rotated towards the pelvis since the previous study.   Electronically Signed  By: Lucienne Capers M.D.  On: 09/12/2015 02:28   CT scan personally reviewed and with the patient.  Assessment & Plan:    1. Right UPJ stone s/p stent We discussed definitive management of her stones.  We discussed various treatment options including ESWL vs. ureteroscopy, laser lithotripsy, and stent. We discussed the risks and benefits of both including bleeding, infection, damage to surrounding structures, efficacy with need for possible further intervention, and need for temporary ureteral stent.  Given multiple nonobstructing stones on the right, I would recommend ureteroscopy to attempt to clear her of her stone burden on the side. She may eventually need treatment for her left side as well given the size of these nonobstructing stones.  Her questions were answered. She has agreed to proceed as planned.  Preop urine culture  2. Metabolic stone diease We spent some time today discussing metabolic stone disease and stone prevention techniques.  A clean need a [id number] urine metabolic workup once she is cleared of her stone burden.    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 Increased risk for stone formation.  [name], MD  [facility] [address], [place], [place] [address] [phone]

## 2015-09-29 NOTE — Anesthesia Procedure Notes (Signed)
Procedure Name: Intubation Date/Time: 09/29/2015 1:57 PM Performed by: Nelda Marseille Pre-anesthesia Checklist: Patient identified, Patient being monitored, Timeout performed, Emergency Drugs available and Suction available Patient Re-evaluated:Patient Re-evaluated prior to inductionOxygen Delivery Method: Circle system utilized Preoxygenation: Pre-oxygenation with 100% oxygen Intubation Type: IV induction Ventilation: Mask ventilation without difficulty Laryngoscope Size: Mac and 3 Grade View: Grade I Tube type: Oral Tube size: 7.0 mm Number of attempts: 1 Airway Equipment and Method: Stylet Placement Confirmation: ETT inserted through vocal cords under direct vision,  positive ETCO2 and breath sounds checked- equal and bilateral Secured at: 21 cm Tube secured with: Tape Dental Injury: Teeth and Oropharynx as per pre-operative assessment

## 2015-09-30 NOTE — Anesthesia Postprocedure Evaluation (Signed)
Anesthesia Post Note  Patient: MIROSLAVA DRUMWRIGHT  Procedure(s) Performed: Procedure(s) (LRB): CYSTOSCOPY/URETEROSCOPY/HOLMIUM LASER/STENT PLACEMENT/ STENT EXCHANGE (Right) STONE EXTRACTION WITH BASKET (Right)  Patient location during evaluation: PACU Anesthesia Type: General Level of consciousness: awake and alert Pain management: pain level controlled Vital Signs Assessment: post-procedure vital signs reviewed and stable Respiratory status: spontaneous breathing, nonlabored ventilation, respiratory function stable and patient connected to nasal cannula oxygen Cardiovascular status: blood pressure returned to baseline and stable Postop Assessment: no signs of nausea or vomiting Anesthetic complications: no    Last Vitals:  Filed Vitals:   09/29/15 1543 09/29/15 1554  BP: 131/52 132/62  Pulse: 62 66  Temp:  35.8 C  Resp: 14 16    Last Pain:  Filed Vitals:   09/30/15 0808  PainSc: 0-No pain                 Precious Haws Harveen Flesch

## 2015-10-06 LAB — STONE ANALYSIS
Ca Oxalate,Dihydrate: 10 %
Ca Oxalate,Monohydr.: 70 %
Ca phos cry stone ql IR: 20 %
Stone Weight KSTONE: 4 mg

## 2015-10-27 ENCOUNTER — Ambulatory Visit
Admission: RE | Admit: 2015-10-27 | Discharge: 2015-10-27 | Disposition: A | Discharge status: Home / Self Care | Payer: BLUE CROSS/BLUE SHIELD | Source: Ambulatory Visit | Attending: Urology | Admitting: Urology

## 2015-10-27 DIAGNOSIS — N200 Calculus of kidney: Secondary | ICD-10-CM | POA: Diagnosis present

## 2015-10-27 DIAGNOSIS — D1803 Hemangioma of intra-abdominal structures: Secondary | ICD-10-CM | POA: Diagnosis not present

## 2015-10-29 ENCOUNTER — Ambulatory Visit (INDEPENDENT_AMBULATORY_CARE_PROVIDER_SITE_OTHER): Payer: BLUE CROSS/BLUE SHIELD | Admitting: Urology

## 2015-10-29 ENCOUNTER — Encounter: Payer: Self-pay | Admitting: Urology

## 2015-10-29 VITALS — BP 130/79 | HR 67 | Ht 60.0 in | Wt 98.0 lb

## 2015-10-29 DIAGNOSIS — N200 Calculus of kidney: Secondary | ICD-10-CM | POA: Diagnosis not present

## 2015-10-29 NOTE — Progress Notes (Signed)
10/29/2015 4:15 PM   Megan Mcintosh Sep 27, 1955 [id number]  Referring provider: [name], MD [place], [place] [address]  Chief Complaint  Patient presents with  . Results    4wk     HPI: 60 year old female who returns today approximately 1 month following right ureteroscopy, laser lithotripsy. She initially presented to the emergency room in April with severe right flank pain found to have a 7 mm UPJ stone along with bilateral nonobstructing stones. She was taken to the operating room on 09/29/2015 for ureteroscopy which was uncomplicated on the right. Her stent was subsequently removed.  Follow-up renal ultrasound shows no residual hydronephrosis. She continues to have nonobstructing left-sided stones. No obvious residual right-sided stones.  Prior to this episode, she passed 2 stones more than 30 years ago but has a strong family history of kidney stones.  She is very physically fit and active person. She does think that she gets dehydrated at times. She is to monitor reading about kidney stones and how to prevent them.  Overall, she is feeling fine today. She has no flank pain or urinary symptoms.  Review of stone analysis reveals 10% calcium oxalate dihydrate, 70% calcium oxalate monohydrate, 20% calcium phosphate.   PMH: Past Medical History  Diagnosis Date  . Pinguecula of right eye     removed by Amado Nash  . Nephrolithiasis 1987  . Cervical dysplasia 1980    s/p freezing  . Arthritis     Surgical History: Past Surgical History  Procedure Laterality Date  . Breast surgery  JAN 2012    right breast , BENIGN Rochel Brome  . Foot surgery  2006    [facility]:  silicone implants in both great toes   . Tubal ligation  1990  . Cesarean section      x 2  . Pilonidal  1979    cyst excised   . Breast biopsy Right 2012  . Cystoscopy with stent placement Right 09/12/2015    Procedure: CYSTOSCOPY WITH STENT PLACEMENT;  Surgeon: Ardis Hughs, MD;  Location: [facility] ORS;  Service: Urology;  Laterality: Right;  . Cystoscopy/ureteroscopy/holmium laser/stent placement Right 09/29/2015    Procedure: CYSTOSCOPY/URETEROSCOPY/HOLMIUM LASER/STENT PLACEMENT/ STENT EXCHANGE;  Surgeon: [name], MD;  Location: [facility] OR[facility];  Service: Urology;  Laterality: Right;  . Stone extraction with basket Right 09/29/2015    Procedure: STONE EXTRACTION WITH BASKET;  Surgeon: [name], MD;  Location: [facility] ORS;  Service: Urology;  Laterality: Right;    Home Medications:    Medication List    Notice  As of 10/29/2015  4:15 PM   You have not been prescribed any medications.      Allergies: No Known Allergies  Family History: Family History  Problem Relation Age of Onset  . Aneurysm Mother 75    Hemorrhage CVA  . Stroke Mother   . Urolithiasis Mother   . Kidney disease Mother   . Birth defects Maternal Uncle   . Cancer Neg Hx   . Prostate cancer Neg Hx   . Kidney cancer Neg Hx     Social History:  reports that she has never smoked. She has never used smokeless tobacco. She reports that she drinks alcohol. She reports that she does not use illicit drugs.  ROS: UROLOGY Frequent Urination?: No Hard to postpone urination?: No Burning/pain with urination?: No Get up at night to urinate?: No Leakage of urine?: No Urine stream starts and stops?: No Trouble starting  stream?: No Do you have to strain to urinate?: No Blood in urine?: No Urinary tract infection?: No Sexually transmitted disease?: No Injury to kidneys or bladder?: No Painful intercourse?: No Weak stream?: No Currently pregnant?: No Vaginal bleeding?: No Last menstrual period?: n  Gastrointestinal Nausea?: No Vomiting?: No Indigestion/heartburn?: No Diarrhea?: No Constipation?: No  Constitutional Fever: No Night sweats?: No Weight loss?: No Fatigue?: No  Skin Itching?: No  Eyes Blurred vision?: No Double vision?: No  Ears/Nose/Throat Sore  throat?: No Sinus problems?: No  Hematologic/Lymphatic Swollen glands?: No Easy bruising?: No  Cardiovascular Leg swelling?: No Chest pain?: No  Respiratory Cough?: No Shortness of breath?: No  Endocrine Excessive thirst?: No  Musculoskeletal Back pain?: No Joint pain?: No  Neurological Headaches?: No Dizziness?: No  Psychologic Depression?: No Anxiety?: No  Physical Exam: BP 130/79 mmHg  Pulse 67  Ht 5' (1.524 m)  Wt 98 lb (44.453 kg)  BMI 19.14 kg/m2  LMP 05/17/2011  Constitutional:  Alert and oriented, No acute distress. HEENT: [place] AT, moist mucus membranes.  Trachea midline, no masses. Cardiovascular: No clubbing, cyanosis, or edema. Respiratory: Normal respiratory effort, no increased work of breathing. GI: Abdomen is soft, nontender, nondistended, no abdominal masses GU: No CVA tenderness.  Skin: No rashes, bruises or suspicious lesions. Neurologic: Grossly intact, no focal deficits, moving all 4 extremities. Psychiatric: Normal mood and affect.  Laboratory Data: Lab Results  Component Value Date   WBC 7.3 09/12/2015   HGB 14.7 09/12/2015   HCT 43.0 09/12/2015   MCV 87.6 09/12/2015   PLT 396 09/12/2015    Lab Results  Component Value Date   CREATININE 0.80 09/12/2015    Urinalysis UA reviewed,negative  Pertinent Imaging:    Study Result     CLINICAL DATA: Kidney stones  EXAM: RENAL / URINARY TRACT ULTRASOUND COMPLETE  COMPARISON: 09/12/2015 and 11/27/2012.  FINDINGS: Right Kidney:  Length: 9.1 cm. Echogenicity within normal limits. No mass or hydronephrosis visualized.  Left Kidney:  Length: 9.9 cm. Inferior pole calculus measures 5 mm. No hydronephrosis. Two cysts are noted within the left mid and inferior pole. These measure up to 1.6 cm. Normal parenchymal echogenicity.  Bladder:  Appears normal for degree of bladder distention.  Other: Echogenic focus within the liver measures 1.6 x 1.5 x 1.7  cm.  IMPRESSION: 1. No hydronephrosis 2. Nonobstructing left renal calculus. 3. Echogenic structure in right lobe of liver is identified. This corresponds with a previously demonstrated right lobe of liver hemangioma.   Electronically Signed  By: Kerby Moors M.D.  On: 10/27/2015 16:44     Assessment & Plan:    1. Kidney stone S/p successful right ureteroscopy in 09/2015 with negative follow-up renal ultrasound.  She continues to have fairly significant left-sided lower pole stone burden, asymptomatic. Options for treating the stones versus continued observation were discussed in detail. She would like to continue to observe these. Recommend follow-up in 6 months with a KUB to ensure that she is not metabolically active.  We did have a lengthy discussion today about stone prevention.We discussed general stone prevention techniques including drinking plenty water with goal of producing 2.5 L urine daily, increased citric acid intake, avoidance of high oxalate containing foods, and decreased salt intake.  Information about dietary recommendations given today.   - DG Abd 1 View; Future   Return in about 6 months (around 04/29/2016) for KUB, f/u kidney stones.  [name], MD  [facility] [address], [place], [place] [address] [phone]   

## 2015-11-18 ENCOUNTER — Other Ambulatory Visit: Payer: Self-pay | Admitting: Urology

## 2015-12-25 ENCOUNTER — Other Ambulatory Visit (HOSPITAL_COMMUNITY)
Admission: RE | Admit: 2015-12-25 | Discharge: 2015-12-25 | Disposition: A | Discharge status: Home / Self Care | Payer: BLUE CROSS/BLUE SHIELD | Source: Ambulatory Visit | Attending: Internal Medicine | Admitting: Internal Medicine

## 2015-12-25 ENCOUNTER — Ambulatory Visit (INDEPENDENT_AMBULATORY_CARE_PROVIDER_SITE_OTHER): Payer: BLUE CROSS/BLUE SHIELD | Admitting: Internal Medicine

## 2015-12-25 ENCOUNTER — Encounter: Payer: Self-pay | Admitting: Internal Medicine

## 2015-12-25 VITALS — BP 110/78 | HR 73 | Temp 98.0°F | Ht 60.5 in | Wt 96.0 lb

## 2015-12-25 DIAGNOSIS — Z1211 Encounter for screening for malignant neoplasm of colon: Secondary | ICD-10-CM

## 2015-12-25 DIAGNOSIS — Z124 Encounter for screening for malignant neoplasm of cervix: Secondary | ICD-10-CM

## 2015-12-25 DIAGNOSIS — R5383 Other fatigue: Secondary | ICD-10-CM

## 2015-12-25 DIAGNOSIS — Z01419 Encounter for gynecological examination (general) (routine) without abnormal findings: Secondary | ICD-10-CM | POA: Insufficient documentation

## 2015-12-25 DIAGNOSIS — E785 Hyperlipidemia, unspecified: Secondary | ICD-10-CM | POA: Diagnosis not present

## 2015-12-25 DIAGNOSIS — N200 Calculus of kidney: Secondary | ICD-10-CM

## 2015-12-25 DIAGNOSIS — E559 Vitamin D deficiency, unspecified: Secondary | ICD-10-CM

## 2015-12-25 DIAGNOSIS — Z1151 Encounter for screening for human papillomavirus (HPV): Secondary | ICD-10-CM | POA: Insufficient documentation

## 2015-12-25 DIAGNOSIS — Z0000 Encounter for general adult medical examination without abnormal findings: Secondary | ICD-10-CM

## 2015-12-25 DIAGNOSIS — Z1239 Encounter for other screening for malignant neoplasm of breast: Secondary | ICD-10-CM

## 2015-12-25 DIAGNOSIS — N879 Dysplasia of cervix uteri, unspecified: Secondary | ICD-10-CM

## 2015-12-25 LAB — CBC WITH DIFFERENTIAL/PLATELET
BASOS PCT: 0.5 % (ref 0.0–3.0)
Basophils Absolute: 0 10*3/uL (ref 0.0–0.1)
EOS PCT: 0.8 % (ref 0.0–5.0)
Eosinophils Absolute: 0 10*3/uL (ref 0.0–0.7)
HEMATOCRIT: 43.5 % (ref 36.0–46.0)
Hemoglobin: 14.7 g/dL (ref 12.0–15.0)
LYMPHS PCT: 36.6 % (ref 12.0–46.0)
Lymphs Abs: 1.7 10*3/uL (ref 0.7–4.0)
MCHC: 33.8 g/dL (ref 30.0–36.0)
MCV: 89.1 fl (ref 78.0–100.0)
MONOS PCT: 8.6 % (ref 3.0–12.0)
Monocytes Absolute: 0.4 10*3/uL (ref 0.1–1.0)
NEUTROS ABS: 2.5 10*3/uL (ref 1.4–7.7)
Neutrophils Relative %: 53.5 % (ref 43.0–77.0)
PLATELETS: 390 10*3/uL (ref 150.0–400.0)
RBC: 4.89 Mil/uL (ref 3.87–5.11)
RDW: 13.3 % (ref 11.5–15.5)
WBC: 4.7 10*3/uL (ref 4.0–10.5)

## 2015-12-25 LAB — COMPREHENSIVE METABOLIC PANEL
ALT: 11 U/L (ref 0–35)
AST: 21 U/L (ref 0–37)
Albumin: 4.9 g/dL (ref 3.5–5.2)
Alkaline Phosphatase: 69 U/L (ref 39–117)
BUN: 26 mg/dL — ABNORMAL HIGH (ref 6–23)
CALCIUM: 10 mg/dL (ref 8.4–10.5)
CHLORIDE: 102 meq/L (ref 96–112)
CO2: 30 meq/L (ref 19–32)
CREATININE: 0.76 mg/dL (ref 0.40–1.20)
GFR: 82.52 mL/min (ref >60.00)
Glucose, Bld: 92 mg/dL (ref 70–99)
POTASSIUM: 4.3 meq/L (ref 3.5–5.1)
Sodium: 139 mEq/L (ref 135–145)
Total Bilirubin: 0.6 mg/dL (ref 0.2–1.2)
Total Protein: 7.4 g/dL (ref 6.0–8.3)

## 2015-12-25 LAB — LIPID PANEL
CHOL/HDL RATIO: 2
Cholesterol: 267 mg/dL — ABNORMAL HIGH (ref 0–200)
HDL: 125.1 mg/dL (ref >39.00)
LDL Cholesterol: 130 mg/dL — ABNORMAL HIGH (ref 0–99)
NONHDL: 141.43
Triglycerides: 55 mg/dL (ref 0.0–149.0)
VLDL: 11 mg/dL (ref 0.0–40.0)

## 2015-12-25 LAB — VITAMIN D 25 HYDROXY (VIT D DEFICIENCY, FRACTURES): VITD: 41.87 ng/mL (ref 30.00–100.00)

## 2015-12-25 LAB — TSH: TSH: 1.72 u[IU]/mL (ref 0.35–4.50)

## 2015-12-25 NOTE — Progress Notes (Signed)
Patient ID: Megan Mcintosh, female    DOB: [birth year]  Age: 60 y.o. MRN: [id number]  The patient is here for annual wellness examination and management of other chronic and acute problems.  Last seen January 15 2015 PAP smear 2014 mammogram oct 2016 Colonoscopy 2008 Tdap 2014  Hep C and HIV screen 2016    The risk factors are reflected in the social history.  The roster of all physicians providing medical care to patient - is listed in the Snapshot section of the chart.  Home safety : The patient has smoke detectors in the home. They wear seatbelts.  There are no firearms at home. There is no violence in the home.   There is no risks for hepatitis, STDs or HIV. There is no   history of blood transfusion. They have no travel history to infectious disease endemic areas of the world.  The patient has seen their dentist in the last six month. They have seen their eye doctor in the last year  They do not  have excessive sun exposure. Discussed the need for sun protection: hats, long sleeves and use of sunscreen if there is significant sun exposure.  She sees a dermatologist every 6 months .Diet: the importance of a healthy diet is discussed. They do have a healthy diet.  The benefits of regular aerobic exercise were discussed. She walks 4 times per week ,  20 minutes.   Depression screen: there are no signs or vegative symptoms of depression- irritability, change in appetite, anhedonia, sadness/tearfullness.  The following portions of the patient's history were reviewed and updated as appropriate: allergies, current medications, past family history, past medical history,  past surgical history, past social history  and problem list.  Visual acuity was not assessed per patient preference since she has regular follow up with her ophthalmologist. Hearing and body mass index were assessed and reviewed.   During the course of the visit the patient was educated and counseled about appropriate screening  and preventive services including : fall prevention , diabetes screening, nutrition counseling, colorectal cancer screening, and recommended immunizations.    CC: The primary encounter diagnosis was Colon cancer screening. Diagnoses of Hyperlipidemia, Vitamin D deficiency, Other fatigue, Breast cancer screening, Cervical cancer screening, Visit for preventive health examination, Nephrolithiasis, and Cervical dysplasia were also pertinent to this visit.  History Cyndra has a past medical history of Arthritis; Cervical dysplasia (1980); Nephrolithiasis (1987); and Pinguecula of right eye.   She has a past surgical history that includes Breast surgery (JAN 2012); Foot surgery (2006); Tubal ligation (1990); Cesarean section; pilonidal (1979); Breast biopsy (Right, 2012); Cystoscopy with stent placement (Right, 09/12/2015); Cystoscopy/ureteroscopy/holmium laser/stent placement (Right, 09/29/2015); and Stone extraction with basket (Right, 09/29/2015).   Her family history includes Aneurysm (age of onset: 51) in her mother; Birth defects in her maternal uncle; Kidney disease in her mother; Stroke in her mother; Urolithiasis in her mother.She reports that she has never smoked. She has never used smokeless tobacco. She reports that she drinks alcohol. She reports that she does not use drugs.  No outpatient prescriptions prior to visit.   No facility-administered medications prior to visit.     Review of Systems   Patient denies headache, fevers, malaise, unintentional weight loss, skin rash, eye pain, sinus congestion and sinus pain, sore throat, dysphagia,  hemoptysis , cough, dyspnea, wheezing, chest pain, palpitations, orthopnea, edema, abdominal pain, nausea, melena, diarrhea, constipation, flank pain, dysuria, hematuria, urinary  Frequency, nocturia, numbness, tingling, seizures,  Focal  weakness, Loss of consciousness,  Tremor, insomnia, depression, anxiety, and suicidal ideation.      Objective:  BP  110/78   Pulse 73   Temp 98 F (36.7 C) (Oral)   Ht 5' 0.5" (1.537 m)   Wt 96 lb (43.5 kg)   LMP 05/17/2011   SpO2 98%   BMI 18.44 kg/m   Physical Exam   . General Appearance:    Alert, cooperative, no distress, appears stated age  Head:    Normocephalic, without obvious abnormality, atraumatic  Eyes:    PERRL, conjunctiva/corneas clear, EOM's intact, fundi    benign, both eyes  Ears:    Normal TM's and external ear canals, both ears  Nose:   Nares normal, septum midline, mucosa normal, no drainage    or sinus tenderness  Throat:   Lips, mucosa, and tongue normal; teeth and gums normal  Neck:   Supple, symmetrical, trachea midline, no adenopathy;    thyroid:  no enlargement/tenderness/nodules; no carotid   bruit or JVD  Back:     Symmetric, no curvature, ROM normal, no CVA tenderness  Lungs:     Clear to auscultation bilaterally, respirations unlabored  Chest Khanna:    No tenderness or deformity   Heart:    Regular rate and rhythm, S1 and S2 normal, no murmur, rub   or gallop  Breast Exam:    No tenderness, masses, or nipple abnormality  Abdomen:     Soft, non-tender, bowel sounds active all four quadrants,    no masses, no organomegaly  Genitalia:    Pelvic: cervix normal in appearance, external genitalia normal, no adnexal masses or tenderness, no cervical motion tenderness, rectovaginal septum normal, uterus normal size, shape, and consistency and vagina normal without discharge  Extremities:   Extremities normal, atraumatic, no cyanosis or edema  Pulses:   2+ and symmetric all extremities  Skin:   Skin color, texture, turgor normal, no rashes or lesions  Lymph nodes:   Cervical, supraclavicular, and axillary nodes normal  Neurologic:   CNII-XII intact, normal strength, sensation and reflexes    throughout      Assessment & Plan:   Problem List Items Addressed This Visit    Nephrolithiasis    Patient has residual stones on the left sided that are not moving or  causing pain.  Continue hydration use of citric acid.       Cervical dysplasia    PAP smear was done today given history of dysplasia, none in years      Visit for preventive health examination    Annual comprehensive preventive exam was done as well as an evaluation and management of chronic conditions .  During the course of the visit the patient was educated and counseled about appropriate screening and preventive services including :  diabetes screening, lipid analysis with projected  10 year  risk for CAD , nutrition counseling, breast, cervical and colorectal cancer screening, and recommended immunizations.  Printed recommendations for health maintenance screenings was given       Other Visit Diagnoses    Colon cancer screening    -  Primary   Relevant Orders   Ambulatory referral to Gastroenterology   Hyperlipidemia       Relevant Orders   Lipid panel (Completed)   Vitamin D deficiency       Relevant Orders   VITAMIN D 25 Hydroxy (Vit-D Deficiency, Fractures) (Completed)   Other fatigue       Relevant Orders   TSH (  Completed)   Comp Met (CMET) (Completed)   CBC with Differential/Platelet (Completed)   Breast cancer screening       Relevant Orders   MM DIGITAL SCREENING BILATERAL   Cervical cancer screening       Relevant Orders   Cytology - PAP      Ms. Arp does not currently have medications on file.  No orders of the defined types were placed in this encounter.   There are no discontinued medications.  Follow-up: No Follow-up on file.   [name], MD

## 2015-12-25 NOTE — Patient Instructions (Signed)
I agree with getting your calcium through diet given your history of recurrent kiendy stones Menopause is a normal process in which your reproductive ability comes to an end. This process happens gradually over a span of months to years, usually between the ages of 5 and 21. Menopause is complete when you have missed 12 consecutive menstrual periods. It is important to talk with your health care provider about some of the most common conditions that affect postmenopausal women, such as heart disease, cancer, and bone loss (osteoporosis). Adopting a healthy lifestyle and getting preventive care can help to promote your health and wellness. Those actions can also lower your chances of developing some of these common conditions. WHAT SHOULD I KNOW ABOUT MENOPAUSE? During menopause, you may experience a number of symptoms, such as:  Moderate-to-severe hot flashes.  Night sweats.  Decrease in sex drive.  Mood swings.  Headaches.  Tiredness.  Irritability.  Memory problems.  Insomnia. Choosing to treat or not to treat menopausal changes is an individual decision that you make with your health care provider. WHAT SHOULD I KNOW ABOUT HORMONE REPLACEMENT THERAPY AND SUPPLEMENTS? Hormone therapy products are effective for treating symptoms that are associated with menopause, such as hot flashes and night sweats. Hormone replacement carries certain risks, especially as you become older. If you are thinking about using estrogen or estrogen with progestin treatments, discuss the benefits and risks with your health care provider. WHAT SHOULD I KNOW ABOUT HEART DISEASE AND STROKE? Heart disease, heart attack, and stroke become more likely as you age. This may be due, in part, to the hormonal changes that your body experiences during menopause. These can affect how your body processes dietary fats, triglycerides, and cholesterol. Heart attack and stroke are both medical emergencies. There are many  things that you can do to help prevent heart disease and stroke:  Have your blood pressure checked at least every 1-2 years. High blood pressure causes heart disease and increases the risk of stroke.  If you are 10-53 years old, ask your health care provider if you should take aspirin to prevent a heart attack or a stroke.  Do not use any tobacco products, including cigarettes, chewing tobacco, or electronic cigarettes. If you need help quitting, ask your health care provider.  It is important to eat a healthy diet and maintain a healthy weight.  Be sure to include plenty of vegetables, fruits, low-fat dairy products, and lean protein.  Avoid eating foods that are high in solid fats, added sugars, or salt (sodium).  Get regular exercise. This is one of the most important things that you can do for your health.  Try to exercise for at least 150 minutes each week. The type of exercise that you do should increase your heart rate and make you sweat. This is known as moderate-intensity exercise.  Try to do strengthening exercises at least twice each week. Do these in addition to the moderate-intensity exercise.  Know your numbers.Ask your health care provider to check your cholesterol and your blood glucose. Continue to have your blood tested as directed by your health care provider. WHAT SHOULD I KNOW ABOUT CANCER SCREENING? There are several types of cancer. Take the following steps to reduce your risk and to catch any cancer development as early as possible. Breast Cancer  Practice breast self-awareness.  This means understanding how your breasts normally appear and feel.  It also means doing regular breast self-exams. Let your health care provider know about any changes, no  matter how small.  If you are 55 or older, have a clinician do a breast exam (clinical breast exam or CBE) every year. Depending on your age, family history, and medical history, it may be recommended that you also  have a yearly breast X-ray (mammogram).  If you have a family history of breast cancer, talk with your health care provider about genetic screening.  If you are at high risk for breast cancer, talk with your health care provider about having an MRI and a mammogram every year.  Breast cancer (BRCA) gene test is recommended for women who have family members with BRCA-related cancers. Results of the assessment will determine the need for genetic counseling and BRCA1 and for BRCA2 testing. BRCA-related cancers include these types:  Breast. This occurs in males or females.  Ovarian.  Tubal. This may also be called fallopian tube cancer.  Cancer of the abdominal or pelvic lining (peritoneal cancer).  Prostate.  Pancreatic. Cervical, Uterine, and Ovarian Cancer Your health care provider may recommend that you be screened regularly for cancer of the pelvic organs. These include your ovaries, uterus, and vagina. This screening involves a pelvic exam, which includes checking for microscopic changes to the surface of your cervix (Pap test).  For women ages 21-65, health care providers may recommend a pelvic exam and a Pap test every three years. For women ages 56-65, they may recommend the Pap test and pelvic exam, combined with testing for human papilloma virus (HPV), every five years. Some types of HPV increase your risk of cervical cancer. Testing for HPV may also be done on women of any age who have unclear Pap test results.  Other health care providers may not recommend any screening for nonpregnant women who are considered low risk for pelvic cancer and have no symptoms. Ask your health care provider if a screening pelvic exam is right for you.  If you have had past treatment for cervical cancer or a condition that could lead to cancer, you need Pap tests and screening for cancer for at least 20 years after your treatment. If Pap tests have been discontinued for you, your risk factors (such as  having a new sexual partner) need to be reassessed to determine if you should start having screenings again. Some women have medical problems that increase the chance of getting cervical cancer. In these cases, your health care provider may recommend that you have screening and Pap tests more often.  If you have a family history of uterine cancer or ovarian cancer, talk with your health care provider about genetic screening.  If you have vaginal bleeding after reaching menopause, tell your health care provider.  There are currently no reliable tests available to screen for ovarian cancer. Lung Cancer Lung cancer screening is recommended for adults 44-37 years old who are at high risk for lung cancer because of a history of smoking. A yearly low-dose CT scan of the lungs is recommended if you:  Currently smoke.  Have a history of at least 30 pack-years of smoking and you currently smoke or have quit within the past 15 years. A pack-year is smoking an average of one pack of cigarettes per day for one year. Yearly screening should:  Continue until it has been 15 years since you quit.  Stop if you develop a health problem that would prevent you from having lung cancer treatment. Colorectal Cancer  This type of cancer can be detected and can often be prevented.  Routine colorectal cancer  screening usually begins at age 22 and continues through age 55.  If you have risk factors for colon cancer, your health care provider may recommend that you be screened at an earlier age.  If you have a family history of colorectal cancer, talk with your health care provider about genetic screening.  Your health care provider may also recommend using home test kits to check for hidden blood in your stool.  A small camera at the end of a tube can be used to examine your colon directly (sigmoidoscopy or colonoscopy). This is done to check for the earliest forms of colorectal cancer.  Direct examination of  the colon should be repeated every 5-10 years until age 71. However, if early forms of precancerous polyps or small growths are found or if you have a family history or genetic risk for colorectal cancer, you may need to be screened more often. Skin Cancer  Check your skin from head to toe regularly.  Monitor any moles. Be sure to tell your health care provider:  About any new moles or changes in moles, especially if there is a change in a mole's shape or color.  If you have a mole that is larger than the size of a pencil eraser.  If any of your family members has a history of skin cancer, especially at a young age, talk with your health care provider about genetic screening.  Always use sunscreen. Apply sunscreen liberally and repeatedly throughout the day.  Whenever you are outside, protect yourself by wearing long sleeves, pants, a wide-brimmed hat, and sunglasses. WHAT SHOULD I KNOW ABOUT OSTEOPOROSIS? Osteoporosis is a condition in which bone destruction happens more quickly than new bone creation. After menopause, you may be at an increased risk for osteoporosis. To help prevent osteoporosis or the bone fractures that can happen because of osteoporosis, the following is recommended:  If you are 75-90 years old, get at least 1,000 mg of calcium and at least 600 mg of vitamin D per day.  If you are older than age 81 but younger than age 30, get at least 1,200 mg of calcium and at least 600 mg of vitamin D per day.  If you are older than age 26, get at least 1,200 mg of calcium and at least 800 mg of vitamin D per day. Smoking and excessive alcohol intake increase the risk of osteoporosis. Eat foods that are rich in calcium and vitamin D, and do weight-bearing exercises several times each week as directed by your health care provider. WHAT SHOULD I KNOW ABOUT HOW MENOPAUSE AFFECTS [place]? Depression may occur at any age, but it is more common as you become older. Common  symptoms of depression include:  Low or sad mood.  Changes in sleep patterns.  Changes in appetite or eating patterns.  Feeling an overall lack of motivation or enjoyment of activities that you previously enjoyed.  Frequent crying spells. Talk with your health care provider if you think that you are experiencing depression. WHAT SHOULD I KNOW ABOUT IMMUNIZATIONS? It is important that you get and maintain your immunizations. These include:  Tetanus, diphtheria, and pertussis (Tdap) booster vaccine.  Influenza every year before the flu season begins.  Pneumonia vaccine.  Shingles vaccine. Your health care provider may also recommend other immunizations.   This information is not intended to replace advice given to you by your health care provider. Make sure you discuss any questions you have with your health care provider.  Document Released: 07/02/2005 Document Revised: 05/31/2014 Document Reviewed: 01/10/2014 Elsevier Interactive Patient Education [employer].

## 2015-12-28 NOTE — Assessment & Plan Note (Signed)
PAP smear was done today given history of dysplasia, none in years

## 2015-12-28 NOTE — Assessment & Plan Note (Signed)
Patient has residual stones on the left sided that are not moving or causing pain.  Continue hydration use of citric acid.

## 2015-12-28 NOTE — Assessment & Plan Note (Signed)
Annual comprehensive preventive exam was done as well as an evaluation and management of chronic conditions .  During the course of the visit the patient was educated and counseled about appropriate screening and preventive services including :  diabetes screening, lipid analysis with projected  10 year  risk for CAD , nutrition counseling, breast, cervical and colorectal cancer screening, and recommended immunizations.  Printed recommendations for health maintenance screenings was given 

## 2015-12-29 LAB — CYTOLOGY - PAP

## 2015-12-30 ENCOUNTER — Encounter: Payer: Self-pay | Admitting: Internal Medicine

## 2016-02-16 ENCOUNTER — Telehealth: Payer: Self-pay | Admitting: Internal Medicine

## 2016-02-16 ENCOUNTER — Encounter: Payer: Self-pay | Admitting: Internal Medicine

## 2016-02-19 MED ORDER — TRAZODONE HCL 50 MG PO TABS: ORAL_TABLET | ORAL | 3 refills | Status: DC

## 2016-03-11 ENCOUNTER — Ambulatory Visit
Admission: RE | Admit: 2016-03-11 | Discharge: 2016-03-11 | Disposition: A | Discharge status: Home / Self Care | Payer: BLUE CROSS/BLUE SHIELD | Source: Ambulatory Visit | Attending: Internal Medicine | Admitting: Internal Medicine

## 2016-03-11 ENCOUNTER — Other Ambulatory Visit: Payer: Self-pay | Admitting: Internal Medicine

## 2016-03-11 DIAGNOSIS — Z1239 Encounter for other screening for malignant neoplasm of breast: Secondary | ICD-10-CM

## 2016-03-25 ENCOUNTER — Encounter: Payer: Self-pay | Admitting: *Deleted

## 2016-03-26 ENCOUNTER — Encounter
Admission: RE | Disposition: A | Discharge status: Home / Self Care | Payer: Self-pay | Source: Ambulatory Visit | Attending: Unknown Physician Specialty

## 2016-03-26 ENCOUNTER — Ambulatory Visit: Payer: BLUE CROSS/BLUE SHIELD | Admitting: Anesthesiology

## 2016-03-26 ENCOUNTER — Ambulatory Visit
Admission: RE | Admit: 2016-03-26 | Discharge: 2016-03-26 | Disposition: A | Discharge status: Home / Self Care | Payer: BLUE CROSS/BLUE SHIELD | Source: Ambulatory Visit | Attending: Unknown Physician Specialty | Admitting: Unknown Physician Specialty

## 2016-03-26 DIAGNOSIS — Z9889 Other specified postprocedural states: Secondary | ICD-10-CM | POA: Diagnosis not present

## 2016-03-26 DIAGNOSIS — Z87442 Personal history of urinary calculi: Secondary | ICD-10-CM | POA: Diagnosis not present

## 2016-03-26 DIAGNOSIS — M1990 Unspecified osteoarthritis, unspecified site: Secondary | ICD-10-CM | POA: Diagnosis not present

## 2016-03-26 DIAGNOSIS — K640 First degree hemorrhoids: Secondary | ICD-10-CM | POA: Diagnosis not present

## 2016-03-26 DIAGNOSIS — Z8489 Family history of other specified conditions: Secondary | ICD-10-CM | POA: Diagnosis not present

## 2016-03-26 DIAGNOSIS — Z823 Family history of stroke: Secondary | ICD-10-CM | POA: Diagnosis not present

## 2016-03-26 DIAGNOSIS — Z79899 Other long term (current) drug therapy: Secondary | ICD-10-CM | POA: Diagnosis not present

## 2016-03-26 DIAGNOSIS — Z1211 Encounter for screening for malignant neoplasm of colon: Secondary | ICD-10-CM | POA: Diagnosis not present

## 2016-03-26 DIAGNOSIS — Z841 Family history of disorders of kidney and ureter: Secondary | ICD-10-CM | POA: Diagnosis not present

## 2016-03-26 DIAGNOSIS — K621 Rectal polyp: Secondary | ICD-10-CM | POA: Insufficient documentation

## 2016-03-26 DIAGNOSIS — K635 Polyp of colon: Secondary | ICD-10-CM | POA: Insufficient documentation

## 2016-03-26 DIAGNOSIS — K5730 Diverticulosis of large intestine without perforation or abscess without bleeding: Secondary | ICD-10-CM | POA: Diagnosis not present

## 2016-03-26 DIAGNOSIS — D125 Benign neoplasm of sigmoid colon: Secondary | ICD-10-CM | POA: Diagnosis not present

## 2016-03-26 HISTORY — PX: COLONOSCOPY: SHX5424

## 2016-03-26 SURGERY — COLONOSCOPY
Anesthesia: General

## 2016-03-26 MED ORDER — LIDOCAINE HCL (CARDIAC) 20 MG/ML IV SOLN
INTRAVENOUS | Status: DC | PRN
  Administered 2016-03-26: 20 mg via INTRAVENOUS

## 2016-03-26 MED ORDER — PROPOFOL 10 MG/ML IV BOLUS
INTRAVENOUS | Status: DC | PRN
Start: 2016-03-26 — End: 2016-03-26
  Administered 2016-03-26 (×2): 40 mg via INTRAVENOUS

## 2016-03-26 MED ORDER — SODIUM CHLORIDE 0.9 % IV SOLN
INTRAVENOUS | Status: DC
  Administered 2016-03-26: 1000 mL via INTRAVENOUS

## 2016-03-26 MED ORDER — PHENYLEPHRINE HCL 10 MG/ML IJ SOLN
INTRAMUSCULAR | Status: DC | PRN
  Administered 2016-03-26 (×3): 50 ug via INTRAVENOUS

## 2016-03-26 MED ORDER — PROPOFOL 500 MG/50ML IV EMUL
INTRAVENOUS | Status: DC | PRN
  Administered 2016-03-26: 100 ug/kg/min via INTRAVENOUS

## 2016-03-26 MED ORDER — SODIUM CHLORIDE 0.9 % IV SOLN: INTRAVENOUS | Status: DC

## 2016-03-26 MED ORDER — MIDAZOLAM HCL 2 MG/2ML IJ SOLN
INTRAMUSCULAR | Status: DC | PRN
  Administered 2016-03-26: 1 mg via INTRAVENOUS

## 2016-03-26 NOTE — Transfer of Care (Signed)
Immediate Anesthesia Transfer of Care Note  Patient: CORRA BENCH  Procedure(s) Performed: Procedure(s): COLONOSCOPY (N/A)  Patient Location: PACU  Anesthesia Type:General  Level of Consciousness: awake and responds to stimulation  Airway & Oxygen Therapy: Patient Spontanous Breathing and Patient connected to nasal cannula oxygen  Post-op Assessment: Report given to RN and Post -op Vital signs reviewed and stable  Post vital signs: Reviewed and stable  Last Vitals:  Vitals:   03/26/16 1015 03/26/16 1016  BP: (!) 89/50 (!) 100/55  Pulse: (!) 59 (!) 59  Resp: (!) 21 18  Temp: 36.4 C     Last Pain:  Vitals:   03/26/16 1015  TempSrc: Tympanic         Complications: No apparent anesthesia complications

## 2016-03-26 NOTE — Anesthesia Postprocedure Evaluation (Signed)
Anesthesia Post Note  Patient: BILINDA CUBBERLEY  Procedure(s) Performed: Procedure(s) (LRB): COLONOSCOPY (N/A)  Patient location during evaluation: PACU Anesthesia Type: General Level of consciousness: awake Pain management: pain level controlled Vital Signs Assessment: post-procedure vital signs reviewed and stable Respiratory status: spontaneous breathing Cardiovascular status: stable Anesthetic complications: no    Last Vitals:  Vitals:   03/26/16 1035 03/26/16 1045  BP: 121/66 114/77  Pulse: (!) 50 (!) 53  Resp: 13 16  Temp:      Last Pain:  Vitals:   03/26/16 1015  TempSrc: Tympanic                 VAN STAVEREN,Raziel Koenigs

## 2016-03-26 NOTE — Anesthesia Preprocedure Evaluation (Signed)
Anesthesia Evaluation  Patient identified by MRN, date of birth, ID band Patient awake    Reviewed: Allergy & Precautions, NPO status , Patient's Chart, lab work & pertinent test results  Airway Mallampati: II       Dental  (+) Teeth Intact   Pulmonary neg pulmonary ROS,    breath sounds clear to auscultation       Cardiovascular Exercise Tolerance: Good negative cardio ROS   Rhythm:Regular Rate:Normal     Neuro/Psych    GI/Hepatic negative GI ROS, Neg liver ROS,   Endo/Other  negative endocrine ROS  Renal/GU      Musculoskeletal   Abdominal Normal abdominal exam  (+)   Peds negative pediatric ROS (+)  Hematology negative hematology ROS (+)   Anesthesia Other Findings   Reproductive/Obstetrics                             Anesthesia Physical Anesthesia Plan  ASA: II  Anesthesia Plan: General   Post-op Pain Management:    Induction: Intravenous  Airway Management Planned: Natural Airway and Nasal Cannula  Additional Equipment:   Intra-op Plan:   Post-operative Plan:   Informed Consent: I have reviewed the patients History and Physical, chart, labs and discussed the procedure including the risks, benefits and alternatives for the proposed anesthesia with the patient or authorized representative who has indicated his/her understanding and acceptance.     Plan Discussed with: CRNA  Anesthesia Plan Comments:         Anesthesia Quick Evaluation

## 2016-03-26 NOTE — H&P (Signed)
Primary Care Physician:  Megan Mc, MD Primary Gastroenterologist:  Dr. Vira Agar  Pre-Procedure History & Physical: HPI:  Megan Mcintosh is a 60 y.o. female is here for an colonoscopy.   Past Medical History:  Diagnosis Date  . Arthritis   . Cervical dysplasia 1980   s/p freezing  . Nephrolithiasis 1987  . Pinguecula of right eye    removed by Megan Mcintosh    Past Surgical History:  Procedure Laterality Date  . BREAST BIOPSY Right 2012  . BREAST SURGERY  JAN 2012   right breast , BENIGN Megan Mcintosh  . CESAREAN SECTION     x 2  . CYSTOSCOPY WITH STENT PLACEMENT Right 09/12/2015   Procedure: CYSTOSCOPY WITH STENT PLACEMENT;  Surgeon: Megan Hughs, MD;  Location: [facility] ORS;  Service: Urology;  Laterality: Right;  . CYSTOSCOPY/URETEROSCOPY/HOLMIUM LASER/STENT PLACEMENT Right 09/29/2015   Procedure: CYSTOSCOPY/URETEROSCOPY/HOLMIUM LASER/STENT PLACEMENT/ STENT EXCHANGE;  Surgeon: Megan Espy, MD;  Location: [facility] ORS;  Service: Urology;  Laterality: Right;  . FOOT SURGERY  2006   [facility]:  silicone implants in both great toes   . pilonidal  1979   cyst excised   . STONE EXTRACTION WITH BASKET Right 09/29/2015   Procedure: STONE EXTRACTION WITH BASKET;  Surgeon: Megan Espy, MD;  Location: [facility] ORS;  Service: Urology;  Laterality: Right;  . TUBAL LIGATION  1990    Prior to Admission medications   Medication Sig Start Date End Date Taking? Authorizing Provider  traZODone (DESYREL) 50 MG tablet Take 0.5 to 1 tablet 1 hour before bedtime for insomnia 02/19/16  Yes Megan Mc, MD    Allergies as of 02/09/2016  . (No Known Allergies)    Family History  Problem Relation Age of Onset  . Aneurysm Mother 80    Hemorrhage CVA  . Stroke Mother   . Urolithiasis Mother   . Kidney disease Mother   . Birth defects Maternal Uncle   . Cancer Neg Hx   . Prostate cancer Neg Hx   . Kidney cancer Neg Hx     Social History   Social History  . Marital status: Married     Spouse name: N/A  . Number of children: N/A  . Years of education: N/A   Occupational History  . Not on file.   Social History Main Topics  . Smoking status: Never Smoker  . Smokeless tobacco: Never Used  . Alcohol use Yes     Comment: occasioanlly  . Drug use: No  . Sexual activity: Not on file   Other Topics Concern  . Not on file   Social History Narrative  . No narrative on file    Review of Systems: See HPI, otherwise negative ROS  Physical Exam: BP 127/78   Pulse 66   Temp 98 F (36.7 C) (Tympanic)   Resp 16   Ht 5\' 1"  (1.549 m)   Wt 43.1 kg (95 lb)   LMP 05/17/2011   SpO2 97%   BMI 17.95 kg/m  General:   Alert,  pleasant and cooperative in NAD Head:  Normocephalic and atraumatic. Neck:  Supple; no masses or thyromegaly. Lungs:  Clear throughout to auscultation.    Heart:  Regular rate and rhythm. Abdomen:  Soft, nontender and nondistended. Normal bowel sounds, without guarding, and without rebound.   Neurologic:  Alert and  oriented x4;  grossly normal neurologically.  Impression/Plan: Megan Mcintosh is here for an colonoscopy to be performed for Screening  Risks, benefits, limitations, and alternatives regarding  colonoscopy have been reviewed with the patient.  Questions have been answered.  All parties agreeable.   Megan Cheers, MD  03/26/2016, 9:37 AM

## 2016-03-26 NOTE — Anesthesia Procedure Notes (Signed)
Performed by: Lance Muss Pre-anesthesia Checklist: Patient identified, Emergency Drugs available, Patient being monitored, Timeout performed and Suction available Patient Re-evaluated:Patient Re-evaluated prior to inductionOxygen Delivery Method: Nasal cannula Intubation Type: IV induction

## 2016-03-26 NOTE — Op Note (Signed)
[facility] Gastroenterology Patient Name: Megan Mcintosh Procedure Date: 03/26/2016 9:39 AM MRN: [id number] Account #: [id number] Date of Birth: [birth year] Admit Type: Outpatient Age: 60 Room: [facility] ENDO ROOM 4 Gender: Female Note Status: Finalized Procedure:            Colonoscopy Indications:          Screening for colorectal malignant neoplasm Providers:            Manya Silvas, MD Referring MD:         Deborra Medina, MD (Referring MD) Medicines:            Propofol per Anesthesia Complications:        No immediate complications. Procedure:            Pre-Anesthesia Assessment:                       - After reviewing the risks and benefits, the patient                        was deemed in satisfactory condition to undergo the                        procedure.                       After obtaining informed consent, the colonoscope was                        passed under direct vision. Throughout the procedure,                        the patient's blood pressure, pulse, and oxygen                        saturations were monitored continuously. The                        Colonoscope was introduced through the anus and                        advanced to the the cecum, identified by appendiceal                        orifice and ileocecal valve. The colonoscopy was                        performed without difficulty. The patient tolerated the                        procedure well. The quality of the bowel preparation                        was excellent. Findings:      A 6 mm polyp was found in the recto-sigmoid colon. The polyp was       sessile. The polyp was removed with a hot snare. Resection and retrieval       were complete.      A small polyp was found in the recto-sigmoid colon. The polyp was       sessile. The polyp was removed with a hot snare. Resection and retrieval  were complete.      A diminutive polyp was found in the recto-sigmoid colon. The  polyp was       sessile. The polyp was removed with a jumbo cold forceps. Resection and       retrieval were complete.      A diminutive polyp was found in the rectum. The polyp was sessile. The       polyp was removed with a jumbo cold forceps. Resection and retrieval       were complete.      A few small and large-mouthed diverticula were found in the sigmoid       colon.      Internal hemorrhoids were found during endoscopy. The hemorrhoids were       small and Grade I (internal hemorrhoids that do not prolapse).      The exam was otherwise without abnormality. Impression:           - One 6 mm polyp at the recto-sigmoid colon, removed                        with a hot snare. Resected and retrieved.                       - One small polyp at the recto-sigmoid colon, removed                        with a hot snare. Resected and retrieved.                       - One diminutive polyp at the recto-sigmoid colon,                        removed with a jumbo cold forceps. Resected and                        retrieved.                       - One diminutive polyp in the rectum, removed with a                        jumbo cold forceps. Resected and retrieved.                       - Diverticulosis in the sigmoid colon.                       - Internal hemorrhoids.                       - The examination was otherwise normal. Recommendation:       - Await pathology results. Manya Silvas, MD 03/26/2016 10:14:02 AM This report has been signed electronically. Number of Addenda: 0 Note Initiated On: 03/26/2016 9:39 AM Scope Withdrawal Time: 0 hours 17 minutes 19 seconds  Total Procedure Duration: 0 hours 24 minutes 16 seconds       [facility]

## 2016-03-29 ENCOUNTER — Encounter: Payer: Self-pay | Admitting: Unknown Physician Specialty

## 2016-03-29 LAB — SURGICAL PATHOLOGY

## 2016-04-30 ENCOUNTER — Ambulatory Visit
Admission: RE | Admit: 2016-04-30 | Discharge: 2016-04-30 | Disposition: A | Discharge status: Home / Self Care | Payer: BLUE CROSS/BLUE SHIELD | Source: Ambulatory Visit | Attending: Urology | Admitting: Urology

## 2016-04-30 ENCOUNTER — Ambulatory Visit: Payer: BLUE CROSS/BLUE SHIELD | Admitting: Urology

## 2016-04-30 VITALS — BP 128/81 | HR 68 | Ht 61.0 in | Wt 96.0 lb

## 2016-04-30 DIAGNOSIS — N200 Calculus of kidney: Secondary | ICD-10-CM

## 2016-04-30 NOTE — Progress Notes (Signed)
04/30/2016 12:03 PM   Megan Mcintosh 28-Nov-1955 [id number]  Referring provider: [name], MD [name], [place] [address]  Chief Complaint  Patient presents with  . Nephrolithiasis    82month    HPI: 60 year old female who returns today For follow-up of kidney stones. KUB today shows a punctate right-sided stone as well as stable left-sided stone burden including conglomerative lower pole stones, unchanged.  She underwent  right ureteroscopy, laser lithotripsy 09/29/15  treatment of a 7 mm UPJ stone along with bilateral nonobstructing stones.   Prior to this episode, she passed 2 stones more than 30 years ago but has a strong family history of kidney stones.  Previous stone analysis reveals 10% calcium oxalate dihydrate, 70% calcium oxalate monohydrate, 20% calcium phosphate.  Today, she has no complaints. She's had no stone episodes since her last visit. No urinary symptoms. She has been trying to drink increasing amounts of water, particularly when doing hot yoga.  She is cut out high oxalate containing foods out of her diet. She watches her sodium intake.   PMH: Past Medical History:  Diagnosis Date  . Arthritis   . Cervical dysplasia 1980   s/p freezing  . Nephrolithiasis 1987  . Pinguecula of right eye    removed by Megan Mcintosh    Surgical History: Past Surgical History:  Procedure Laterality Date  . BREAST BIOPSY Right 2012  . BREAST SURGERY  JAN 2012   right breast , BENIGN Megan Mcintosh  . CESAREAN SECTION     x 2  . COLONOSCOPY N/A 03/26/2016   Procedure: COLONOSCOPY;  Surgeon: Megan Silvas, MD;  Location: [facility] ENDOSCOPY;  Service: Endoscopy;  Laterality: N/A;  . CYSTOSCOPY WITH STENT PLACEMENT Right 09/12/2015   Procedure: CYSTOSCOPY WITH STENT PLACEMENT;  Surgeon: Megan Hughs, MD;  Location: [facility] ORS;  Service: Urology;  Laterality: Right;  . CYSTOSCOPY/URETEROSCOPY/HOLMIUM LASER/STENT PLACEMENT Right 09/29/2015   Procedure: CYSTOSCOPY/URETEROSCOPY/HOLMIUM LASER/STENT PLACEMENT/ STENT EXCHANGE;  Surgeon: Megan Espy, MD;  Location: [facility] ORS;  Service: Urology;  Laterality: Right;  . FOOT SURGERY  2006   [facility]:  silicone implants in both great toes   . pilonidal  1979   cyst excised   . STONE EXTRACTION WITH BASKET Right 09/29/2015   Procedure: STONE EXTRACTION WITH BASKET;  Surgeon: Megan Espy, MD;  Location: [facility] ORS;  Service: Urology;  Laterality: Right;  . TUBAL LIGATION  1990    Home Medications:    Medication List       Accurate as of 04/30/16 11:59 PM. Always use your most recent med list.          traZODone 50 MG tablet Commonly known as:  DESYREL Take 0.5 to 1 tablet 1 hour before bedtime for insomnia       Allergies: No Known Allergies  Family History: Family History  Problem Relation Age of Onset  . Aneurysm Mother 9    Hemorrhage CVA  . Stroke Mother   . Urolithiasis Mother   . Kidney disease Mother   . Birth defects Maternal Uncle   . Cancer Neg Hx   . Prostate cancer Neg Hx   . Kidney cancer Neg Hx     Social History:  reports that she has never smoked. She has never used smokeless tobacco. She reports that she drinks alcohol. She reports that she does not use drugs.  ROS: UROLOGY Frequent Urination?: No Hard to postpone urination?: No Burning/pain with urination?: No Get up  at night to urinate?: No Leakage of urine?: No Urine stream starts and stops?: No Trouble starting stream?: No Do you have to strain to urinate?: No Blood in urine?: No Urinary tract infection?: No Sexually transmitted disease?: No Injury to kidneys or bladder?: No Painful intercourse?: No Weak stream?: No Currently pregnant?: No Vaginal bleeding?: No Last menstrual period?: n  Gastrointestinal Nausea?: No Vomiting?: No Indigestion/heartburn?: No Diarrhea?: No Constipation?: No  Constitutional Fever: No Night sweats?: No Weight loss?: No Fatigue?:  No  Skin Skin rash/lesions?: No Itching?: No  Eyes Blurred vision?: No Double vision?: No  Ears/Nose/Throat Sore throat?: No Sinus problems?: No  Hematologic/Lymphatic Swollen glands?: No Easy bruising?: No  Cardiovascular Leg swelling?: No Chest pain?: No  Respiratory Cough?: No Shortness of breath?: No  Endocrine Excessive thirst?: No  Musculoskeletal Back pain?: No Joint pain?: No  Neurological Headaches?: No Dizziness?: No  Psychologic Depression?: No Anxiety?: No  Physical Exam: BP 128/81   Pulse 68   Ht 5\' 1"  (1.549 m)   Wt 96 lb (43.5 kg)   LMP 05/17/2011   BMI 18.14 kg/m   Constitutional:  Alert and oriented, No acute distress. HEENT: [place] AT, moist mucus membranes.  Trachea midline, no masses. Cardiovascular: No clubbing, cyanosis, or edema. Respiratory: Normal respiratory effort, no increased work of breathing. GI: Abdomen is soft, nontender, nondistended, no abdominal masses GU: No CVA tenderness.  Skin: No rashes, bruises or suspicious lesions. Neurologic: Grossly intact, no focal deficits, moving all 4 extremities. Psychiatric: Normal mood and affect.  Laboratory Data: Lab Results  Component Value Date   WBC 4.7 12/25/2015   HGB 14.7 12/25/2015   HCT 43.5 12/25/2015   MCV 89.1 12/25/2015   PLT 390.0 12/25/2015    Lab Results  Component Value Date   CREATININE 0.76 12/25/2015    Pertinent Imaging: CLINICAL DATA:  Nephrolithiasis.  EXAM: ABDOMEN - 1 VIEW  COMPARISON:  CT 09/12/2015.  FINDINGS: Bilateral nephrolithiasis. Multiple pelvic calcifications consistent with phleboliths. No bowel distention. Stool noted throughout the colon. Degenerative changes lumbar spine and both hips.  IMPRESSION: 1. Bilateral nephrolithiasis. Multiple pelvic calcifications noted consistent with phleboliths.  2. No bowel distention.  Stool noted throughout the colon.   Electronically Signed   By: Megan Mcintosh  Register   On:  04/30/2016 09:01  KUB personally reviewed today with the patient. This is compared to previous imaging modalities including previous CT scan.  Assessment & Plan:    1. Kidney stone S/p successful right ureteroscopy in 09/2015 with negative follow-up renal ultrasound.  She continues to have fairly significant left-sided lower pole stone burden, asymptomatic, unchanged.  Punctate possible new right sided stone.  Continues to desire observation.  Recommend follow-up in 12 months with a KUB  Or sooner as needed.  Stone diet/ lifestyle precautions reviewed.   Return in about 1 year (around 04/30/2017) for KUB.  Megan Espy, MD  [facility] [address] [place], [place] [address] [phone]

## 2016-05-24 DIAGNOSIS — E559 Vitamin D deficiency, unspecified: Secondary | ICD-10-CM

## 2016-05-24 HISTORY — DX: Vitamin D deficiency, unspecified: E55.9

## 2016-05-25 ENCOUNTER — Ambulatory Visit
Admission: RE | Admit: 2016-05-25 | Discharge: 2016-05-25 | Disposition: A | Discharge status: Home / Self Care | Payer: BC Managed Care – PPO | Source: Ambulatory Visit | Attending: Internal Medicine | Admitting: Internal Medicine

## 2016-05-25 DIAGNOSIS — R928 Other abnormal and inconclusive findings on diagnostic imaging of breast: Secondary | ICD-10-CM | POA: Diagnosis not present

## 2016-05-25 DIAGNOSIS — Z1231 Encounter for screening mammogram for malignant neoplasm of breast: Secondary | ICD-10-CM | POA: Diagnosis not present

## 2016-05-27 ENCOUNTER — Encounter: Payer: Self-pay | Admitting: Internal Medicine

## 2016-05-27 ENCOUNTER — Other Ambulatory Visit: Payer: Self-pay | Admitting: Internal Medicine

## 2016-05-27 DIAGNOSIS — R928 Other abnormal and inconclusive findings on diagnostic imaging of breast: Secondary | ICD-10-CM

## 2016-05-27 DIAGNOSIS — N6310 Unspecified lump in the right breast, unspecified quadrant: Secondary | ICD-10-CM

## 2016-05-31 ENCOUNTER — Ambulatory Visit
Admission: RE | Admit: 2016-05-31 | Discharge: 2016-05-31 | Disposition: A | Discharge status: Home / Self Care | Payer: BC Managed Care – PPO | Source: Ambulatory Visit | Attending: Internal Medicine | Admitting: Internal Medicine

## 2016-05-31 DIAGNOSIS — R928 Other abnormal and inconclusive findings on diagnostic imaging of breast: Secondary | ICD-10-CM | POA: Diagnosis present

## 2016-05-31 DIAGNOSIS — N6310 Unspecified lump in the right breast, unspecified quadrant: Secondary | ICD-10-CM | POA: Insufficient documentation

## 2017-02-15 ENCOUNTER — Ambulatory Visit (INDEPENDENT_AMBULATORY_CARE_PROVIDER_SITE_OTHER): Payer: BC Managed Care – PPO | Admitting: Internal Medicine

## 2017-02-15 ENCOUNTER — Encounter: Payer: Self-pay | Admitting: Internal Medicine

## 2017-02-15 VITALS — BP 118/76 | HR 68 | Temp 97.9°F | Resp 14 | Ht 61.0 in | Wt 98.8 lb

## 2017-02-15 DIAGNOSIS — R7301 Impaired fasting glucose: Secondary | ICD-10-CM

## 2017-02-15 DIAGNOSIS — R5383 Other fatigue: Secondary | ICD-10-CM

## 2017-02-15 DIAGNOSIS — M85852 Other specified disorders of bone density and structure, left thigh: Secondary | ICD-10-CM

## 2017-02-15 DIAGNOSIS — E7800 Pure hypercholesterolemia, unspecified: Secondary | ICD-10-CM

## 2017-02-15 DIAGNOSIS — Z1231 Encounter for screening mammogram for malignant neoplasm of breast: Secondary | ICD-10-CM

## 2017-02-15 DIAGNOSIS — N200 Calculus of kidney: Secondary | ICD-10-CM

## 2017-02-15 DIAGNOSIS — E559 Vitamin D deficiency, unspecified: Secondary | ICD-10-CM

## 2017-02-15 DIAGNOSIS — Z1239 Encounter for other screening for malignant neoplasm of breast: Secondary | ICD-10-CM

## 2017-02-15 DIAGNOSIS — Z0000 Encounter for general adult medical examination without abnormal findings: Secondary | ICD-10-CM | POA: Diagnosis not present

## 2017-02-15 LAB — COMPREHENSIVE METABOLIC PANEL
ALT: 10 U/L (ref 0–35)
AST: 19 U/L (ref 0–37)
Albumin: 4.9 g/dL (ref 3.5–5.2)
Alkaline Phosphatase: 76 U/L (ref 39–117)
BILIRUBIN TOTAL: 0.6 mg/dL (ref 0.2–1.2)
BUN: 23 mg/dL (ref 6–23)
CALCIUM: 10.3 mg/dL (ref 8.4–10.5)
CHLORIDE: 102 meq/L (ref 96–112)
CO2: 33 meq/L — AB (ref 19–32)
Creatinine, Ser: 0.76 mg/dL (ref 0.40–1.20)
GFR: 82.2 mL/min (ref >60.00)
GLUCOSE: 137 mg/dL — AB (ref 70–99)
Potassium: 4.6 mEq/L (ref 3.5–5.1)
Sodium: 141 mEq/L (ref 135–145)
Total Protein: 7 g/dL (ref 6.0–8.3)

## 2017-02-15 LAB — LIPID PANEL
CHOL/HDL RATIO: 2
CHOLESTEROL: 259 mg/dL — AB (ref 0–200)
HDL: 113.2 mg/dL (ref >39.00)
LDL CALC: 111 mg/dL — AB (ref 0–99)
NONHDL: 146.2
Triglycerides: 178 mg/dL — ABNORMAL HIGH (ref 0.0–149.0)
VLDL: 35.6 mg/dL (ref 0.0–40.0)

## 2017-02-15 LAB — CBC WITH DIFFERENTIAL/PLATELET
BASOS PCT: 0.6 % (ref 0.0–3.0)
Basophils Absolute: 0 10*3/uL (ref 0.0–0.1)
EOS PCT: 0.5 % (ref 0.0–5.0)
Eosinophils Absolute: 0 10*3/uL (ref 0.0–0.7)
HEMATOCRIT: 45.1 % (ref 36.0–46.0)
HEMOGLOBIN: 15.1 g/dL — AB (ref 12.0–15.0)
LYMPHS PCT: 19.5 % (ref 12.0–46.0)
Lymphs Abs: 1.4 10*3/uL (ref 0.7–4.0)
MCHC: 33.4 g/dL (ref 30.0–36.0)
MCV: 90.9 fl (ref 78.0–100.0)
MONO ABS: 0.4 10*3/uL (ref 0.1–1.0)
MONOS PCT: 5.5 % (ref 3.0–12.0)
Neutro Abs: 5.4 10*3/uL (ref 1.4–7.7)
Neutrophils Relative %: 73.9 % (ref 43.0–77.0)
Platelets: 409 10*3/uL — ABNORMAL HIGH (ref 150.0–400.0)
RBC: 4.96 Mil/uL (ref 3.87–5.11)
RDW: 13.4 % (ref 11.5–15.5)
WBC: 7.3 10*3/uL (ref 4.0–10.5)

## 2017-02-15 LAB — TSH: TSH: 1.73 u[IU]/mL (ref 0.35–4.50)

## 2017-02-15 LAB — VITAMIN D 25 HYDROXY (VIT D DEFICIENCY, FRACTURES): VITD: 23.9 ng/mL — AB (ref 30.00–100.00)

## 2017-02-15 LAB — LDL CHOLESTEROL, DIRECT: LDL DIRECT: 107 mg/dL

## 2017-02-15 NOTE — Patient Instructions (Addendum)
Mammogram and DEXA ordered     Health Maintenance for Postmenopausal Women Menopause is a normal process in which your reproductive ability comes to an end. This process happens gradually over a span of months to years, usually between the ages of 42 and 18. Menopause is complete when you have missed 12 consecutive menstrual periods. It is important to talk with your health care provider about some of the most common conditions that affect postmenopausal women, such as heart disease, cancer, and bone loss (osteoporosis). Adopting a healthy lifestyle and getting preventive care can help to promote your health and wellness. Those actions can also lower your chances of developing some of these common conditions. What should I know about menopause? During menopause, you may experience a number of symptoms, such as:  Moderate-to-severe hot flashes.  Night sweats.  Decrease in sex drive.  Mood swings.  Headaches.  Tiredness.  Irritability.  Memory problems.  Insomnia.  Choosing to treat or not to treat menopausal changes is an individual decision that you make with your health care provider. What should I know about hormone replacement therapy and supplements? Hormone therapy products are effective for treating symptoms that are associated with menopause, such as hot flashes and night sweats. Hormone replacement carries certain risks, especially as you become older. If you are thinking about using estrogen or estrogen with progestin treatments, discuss the benefits and risks with your health care provider. What should I know about heart disease and stroke? Heart disease, heart attack, and stroke become more likely as you age. This may be due, in part, to the hormonal changes that your body experiences during menopause. These can affect how your body processes dietary fats, triglycerides, and cholesterol. Heart attack and stroke are both medical emergencies. There are many things that you  can do to help prevent heart disease and stroke:  Have your blood pressure checked at least every 1-2 years. High blood pressure causes heart disease and increases the risk of stroke.  If you are 30-59 years old, ask your health care provider if you should take aspirin to prevent a heart attack or a stroke.  Do not use any tobacco products, including cigarettes, chewing tobacco, or electronic cigarettes. If you need help quitting, ask your health care provider.  It is important to eat a healthy diet and maintain a healthy weight. ? Be sure to include plenty of vegetables, fruits, low-fat dairy products, and lean protein. ? Avoid eating foods that are high in solid fats, added sugars, or salt (sodium).  Get regular exercise. This is one of the most important things that you can do for your health. ? Try to exercise for at least 150 minutes each week. The type of exercise that you do should increase your heart rate and make you sweat. This is known as moderate-intensity exercise. ? Try to do strengthening exercises at least twice each week. Do these in addition to the moderate-intensity exercise.  Know your numbers.Ask your health care provider to check your cholesterol and your blood glucose. Continue to have your blood tested as directed by your health care provider.  What should I know about cancer screening? There are several types of cancer. Take the following steps to reduce your risk and to catch any cancer development as early as possible. Breast Cancer  Practice breast self-awareness. ? This means understanding how your breasts normally appear and feel. ? It also means doing regular breast self-exams. Let your health care provider know about any changes, no  matter how small.  If you are 87 or older, have a clinician do a breast exam (clinical breast exam or CBE) every year. Depending on your age, family history, and medical history, it may be recommended that you also have a yearly  breast X-ray (mammogram).  If you have a family history of breast cancer, talk with your health care provider about genetic screening.  If you are at high risk for breast cancer, talk with your health care provider about having an MRI and a mammogram every year.  Breast cancer (BRCA) gene test is recommended for women who have family members with BRCA-related cancers. Results of the assessment will determine the need for genetic counseling and BRCA1 and for BRCA2 testing. BRCA-related cancers include these types: ? Breast. This occurs in males or females. ? Ovarian. ? Tubal. This may also be called fallopian tube cancer. ? Cancer of the abdominal or pelvic lining (peritoneal cancer). ? Prostate. ? Pancreatic.  Cervical, Uterine, and Ovarian Cancer Your health care provider may recommend that you be screened regularly for cancer of the pelvic organs. These include your ovaries, uterus, and vagina. This screening involves a pelvic exam, which includes checking for microscopic changes to the surface of your cervix (Pap test).  For women ages 21-65, health care providers may recommend a pelvic exam and a Pap test every three years. For women ages 51-65, they may recommend the Pap test and pelvic exam, combined with testing for human papilloma virus (HPV), every five years. Some types of HPV increase your risk of cervical cancer. Testing for HPV may also be done on women of any age who have unclear Pap test results.  Other health care providers may not recommend any screening for nonpregnant women who are considered low risk for pelvic cancer and have no symptoms. Ask your health care provider if a screening pelvic exam is right for you.  If you have had past treatment for cervical cancer or a condition that could lead to cancer, you need Pap tests and screening for cancer for at least 20 years after your treatment. If Pap tests have been discontinued for you, your risk factors (such as having a new  sexual partner) need to be reassessed to determine if you should start having screenings again. Some women have medical problems that increase the chance of getting cervical cancer. In these cases, your health care provider may recommend that you have screening and Pap tests more often.  If you have a family history of uterine cancer or ovarian cancer, talk with your health care provider about genetic screening.  If you have vaginal bleeding after reaching menopause, tell your health care provider.  There are currently no reliable tests available to screen for ovarian cancer.  Lung Cancer Lung cancer screening is recommended for adults 106-64 years old who are at high risk for lung cancer because of a history of smoking. A yearly low-dose CT scan of the lungs is recommended if you:  Currently smoke.  Have a history of at least 30 pack-years of smoking and you currently smoke or have quit within the past 15 years. A pack-year is smoking an average of one pack of cigarettes per day for one year.  Yearly screening should:  Continue until it has been 15 years since you quit.  Stop if you develop a health problem that would prevent you from having lung cancer treatment.  Colorectal Cancer  This type of cancer can be detected and can often be prevented.  Routine colorectal cancer screening usually begins at age 15 and continues through age 53.  If you have risk factors for colon cancer, your health care provider may recommend that you be screened at an earlier age.  If you have a family history of colorectal cancer, talk with your health care provider about genetic screening.  Your health care provider may also recommend using home test kits to check for hidden blood in your stool.  A small camera at the end of a tube can be used to examine your colon directly (sigmoidoscopy or colonoscopy). This is done to check for the earliest forms of colorectal cancer.  Direct examination of the  colon should be repeated every 5-10 years until age 77. However, if early forms of precancerous polyps or small growths are found or if you have a family history or genetic risk for colorectal cancer, you may need to be screened more often.  Skin Cancer  Check your skin from head to toe regularly.  Monitor any moles. Be sure to tell your health care provider: ? About any new moles or changes in moles, especially if there is a change in a mole's shape or color. ? If you have a mole that is larger than the size of a pencil eraser.  If any of your family members has a history of skin cancer, especially at a young age, talk with your health care provider about genetic screening.  Always use sunscreen. Apply sunscreen liberally and repeatedly throughout the day.  Whenever you are outside, protect yourself by wearing long sleeves, pants, a wide-brimmed hat, and sunglasses.  What should I know about osteoporosis? Osteoporosis is a condition in which bone destruction happens more quickly than new bone creation. After menopause, you may be at an increased risk for osteoporosis. To help prevent osteoporosis or the bone fractures that can happen because of osteoporosis, the following is recommended:  If you are 97-61 years old, get at least 1,000 mg of calcium and at least 600 mg of vitamin D per day.  If you are older than age 89 but younger than age 71, get at least 1,200 mg of calcium and at least 600 mg of vitamin D per day.  If you are older than age 7, get at least 1,200 mg of calcium and at least 800 mg of vitamin D per day.  Smoking and excessive alcohol intake increase the risk of osteoporosis. Eat foods that are rich in calcium and vitamin D, and do weight-bearing exercises several times each week as directed by your health care provider. What should I know about how menopause affects my mental health? Depression may occur at any age, but it is more common as you become older. Common  symptoms of depression include:  Low or sad mood.  Changes in sleep patterns.  Changes in appetite or eating patterns.  Feeling an overall lack of motivation or enjoyment of activities that you previously enjoyed.  Frequent crying spells.  Talk with your health care provider if you think that you are experiencing depression. What should I know about immunizations? It is important that you get and maintain your immunizations. These include:  Tetanus, diphtheria, and pertussis (Tdap) booster vaccine.  Influenza every year before the flu season begins.  Pneumonia vaccine.  Shingles vaccine.  Your health care provider may also recommend other immunizations. This information is not intended to replace advice given to you by your health care provider. Make sure you discuss any questions you have with  your health care provider. Document Released: 07/02/2005 Document Revised: 11/28/2015 Document Reviewed: 02/11/2015 Elsevier Interactive Patient Education  2018 [employer].

## 2017-02-15 NOTE — Progress Notes (Addendum)
Patient ID: Megan Mcintosh, female    DOB: 07/12/1955  Age: 61 y.o. MRN: [id number]  The patient is here for annual physical  examination and management of other chronic and acute problems.    PAP NORMAL 2017  DUE NEXT YEAR. REMOTE HISTORY OF DYSPLASIA Undwerweight,  But Weight stable   Using Z Quil  For rare episodes of  insomnia .  Occurs rarely, early waking  brought on by worrying about kids   Taking a collagen supplement daily in a powder for joint and gut health   The risk factors are reflected in the social history.  The roster of all physicians providing medical care to patient - is listed in the Snapshot section of the chart.  Activities of daily living:  The patient is 100% independent in all ADLs: dressing, toileting, feeding as well as independent mobility  Home safety : The patient has smoke detectors in the home. They wear seatbelts.  There are no firearms at home. There is no violence in the home.   There is no risks for hepatitis, STDs or HIV. There is no   history of blood transfusion. They have no travel history to infectious disease endemic areas of the world.  The patient has seen their dentist in the last six month. They have seen their eye doctor in the last year.   They do not  have excessive sun exposure but has excessive sun damage . She uses sun protection: hats, long sleeves and use of sunscreen if there is significant sun exposure.   Sees dr Kellie Moor regularly  FOR RECURRENT SKIN CANCERS.   Diet: the importance of a healthy diet is discussed. They do have a healthy diet.  The benefits of regular aerobic exercise were discussed. She walks 4 times per week ,  20 minutes.   Depression screen: there are no signs or vegative symptoms of depression- irritability, change in appetite, anhedonia, sadness/tearfullness.  The following portions of the patient's history were reviewed and updated as appropriate: allergies, current medications, past family history, past  medical history,  past surgical history, past social history  and problem list.  Visual acuity was not assessed per patient preference since she has regular follow up with her ophthalmologist. Hearing and body mass index were assessed and reviewed.   During the course of the visit the patient was educated and counseled about appropriate screening and preventive services including : fall prevention , diabetes screening, nutrition counseling, colorectal cancer screening, and recommended immunizations.    CC: The primary encounter diagnosis was Screening breast examination. Diagnoses of Osteopenia of neck of left femur, Pure hypercholesterolemia, Fatigue, unspecified type, Visit for preventive health examination, Nephrolithiasis, Impaired fasting glucose, and Vitamin D deficiency were also pertinent to this visit.  History Megan Mcintosh has a past medical history of Arthritis; Cervical dysplasia (1980); Nephrolithiasis (1987); and Pinguecula of right eye.   She has a past surgical history that includes Breast surgery (JAN 2012); Foot surgery (2006); Tubal ligation (1990); Cesarean section; pilonidal (1979); Cystoscopy with stent placement (Right, 09/12/2015); Cystoscopy/ureteroscopy/holmium laser/stent placement (Right, 09/29/2015); Stone extraction with basket (Right, 09/29/2015); Colonoscopy (N/A, 03/26/2016); and Breast biopsy (Right, 2012).   Her family history includes Aneurysm (age of onset: 47) in her mother; Birth defects in her maternal uncle; Kidney disease in her mother; Stroke in her mother; Urolithiasis in her mother.She reports that she has never smoked. She has never used smokeless tobacco. She reports that she drinks alcohol. She reports that she does not use drugs.  Outpatient Medications Prior to Visit  Medication Sig Dispense Refill  . traZODone (DESYREL) 50 MG tablet Take 0.5 to 1 tablet 1 hour before bedtime for insomnia (Patient not taking: Reported on 02/15/2017) 30 tablet 3   No  facility-administered medications prior to visit.     Review of Systems   Patient denies headache, fevers, malaise, unintentional weight loss, skin rash, eye pain, sinus congestion and sinus pain, sore throat, dysphagia,  hemoptysis , cough, dyspnea, wheezing, chest pain, palpitations, orthopnea, edema, abdominal pain, nausea, melena, diarrhea, constipation, flank pain, dysuria, hematuria, urinary  Frequency, nocturia, numbness, tingling, seizures,  Focal weakness, Loss of consciousness,  Tremor, insomnia, depression, anxiety, and suicidal ideation.     Objective:  BP 118/76 (BP Location: Left Arm, Patient Position: Sitting, Cuff Size: Normal)   Pulse 68   Temp 97.9 F (36.6 C) (Oral)   Resp 14   Ht 5\' 1"  (1.549 m)   Wt 98 lb 12.8 oz (44.8 kg)   LMP 05/17/2011   SpO2 99%   BMI 18.67 kg/m   Physical Exam   General appearance: alert, cooperative and appears stated age Head: Normocephalic, without obvious abnormality, atraumatic Eyes: conjunctivae/corneas clear. PERRL, EOM's intact. Fundi benign. Ears: normal TM's and external ear canals both ears Nose: Nares normal. Septum midline. Mucosa normal. No drainage or sinus tenderness. Throat: lips, mucosa, and tongue normal; teeth and gums normal Neck: no adenopathy, no carotid bruit, no JVD, supple, symmetrical, trachea midline and thyroid not enlarged, symmetric, no tenderness/mass/nodules Lungs: clear to auscultation bilaterally Breasts: normal appearance, no masses or tenderness Heart: regular rate and rhythm, S1, S2 normal, no murmur, click, rub or gallop Abdomen: soft, non-tender; bowel sounds normal; no masses,  no organomegaly Extremities: extremities normal, atraumatic, no cyanosis or edema Pulses: 2+ and symmetric Skin: excessive skin damage covering arms legs, and trunk  Neurologic: Alert and oriented X 3, normal strength and tone. Normal symmetric reflexes. Normal coordination and gait.      Assessment & Plan:   Problem  List Items Addressed This Visit    Impaired fasting glucose    Return for a1c       Relevant Orders   Basic metabolic panel   Hemoglobin A1c   Nephrolithiasis    Patient is followed annually by Urology for  residual stones on the left sided that are not moving or causing pain.  Continue hydration,  use of citric acid.  She is avoiding calcium supplementation      Visit for preventive health examination    Annual comprehensive preventive exam was done as well as an evaluation and management of chronic conditions .  During the course of the visit the patient was educated and counseled about appropriate screening and preventive services including :  diabetes screening, lipid analysis with projected  10 year  risk for CAD , nutrition counseling, breast, cervical and colorectal cancer screening, and recommended immunizations.  Printed recommendations for health maintenance screenings was give      Vitamin D deficiency    Treating with weekly drisdol x 3 months        Other Visit Diagnoses    Screening breast examination    -  Primary   Relevant Orders   MM SCREENING BREAST TOMO BILATERAL   Osteopenia of neck of left femur       Relevant Orders   DG Bone Density   Comprehensive metabolic panel (Completed)   TSH (Completed)   VITAMIN D 25 Hydroxy (Vit-D Deficiency, Fractures) (Completed)  Pure hypercholesterolemia       Relevant Orders   LDL cholesterol, direct (Completed)   Lipid panel (Completed)   Fatigue, unspecified type       Relevant Orders   CBC with Differential/Platelet (Completed)      I have discontinued Ms. Rincon's traZODone. I am also having her start on ergocalciferol.  Meds ordered this encounter  Medications  . ergocalciferol (DRISDOL) 50000 units capsule    Sig: Take 1 capsule (50,000 Units total) by mouth once a week.    Dispense:  4 capsule    Refill:  2    Medications Discontinued During This Encounter  Medication Reason  . traZODone (DESYREL) 50 MG  tablet Patient has not taken in last 30 days  . traZODone (DESYREL) 50 MG tablet Patient has not taken in last 30 days    Follow-up: Return in about 1 year (around 02/15/2018).   [name], MD

## 2017-02-16 NOTE — Assessment & Plan Note (Signed)
Annual comprehensive preventive exam was done as well as an evaluation and management of chronic conditions .  During the course of the visit the patient was educated and counseled about appropriate screening and preventive services including :  diabetes screening, lipid analysis with projected  10 year  risk for CAD , nutrition counseling, breast, cervical and colorectal cancer screening, and recommended immunizations.  Printed recommendations for health maintenance screenings was give 

## 2017-02-16 NOTE — Assessment & Plan Note (Signed)
Patient is followed annually by Urology for  residual stones on the left sided that are not moving or causing pain.  Continue hydration,  use of citric acid.  She is avoiding calcium supplementation

## 2017-02-19 DIAGNOSIS — E559 Vitamin D deficiency, unspecified: Secondary | ICD-10-CM | POA: Insufficient documentation

## 2017-02-19 DIAGNOSIS — R7301 Impaired fasting glucose: Secondary | ICD-10-CM | POA: Insufficient documentation

## 2017-02-19 MED ORDER — ERGOCALCIFEROL 1.25 MG (50000 UT) PO CAPS: 50000.0000 [IU] | ORAL_CAPSULE | ORAL | 2 refills | Status: DC

## 2017-02-19 NOTE — Assessment & Plan Note (Signed)
Return for a1c

## 2017-02-19 NOTE — Addendum Note (Signed)
Addended by: [name] on: 02/19/2017 02:14 PM   Modules accepted: Orders

## 2017-02-19 NOTE — Assessment & Plan Note (Signed)
Treating with weekly drisdol x 3 months

## 2017-04-06 ENCOUNTER — Ambulatory Visit: Payer: BC Managed Care – PPO

## 2017-05-06 ENCOUNTER — Ambulatory Visit: Payer: BLUE CROSS/BLUE SHIELD | Admitting: Urology

## 2017-05-19 ENCOUNTER — Ambulatory Visit
Admission: RE | Admit: 2017-05-19 | Discharge: 2017-05-19 | Disposition: A | Discharge status: Home / Self Care | Payer: BC Managed Care – PPO | Source: Ambulatory Visit | Attending: Internal Medicine | Admitting: Internal Medicine

## 2017-05-19 DIAGNOSIS — M85852 Other specified disorders of bone density and structure, left thigh: Secondary | ICD-10-CM | POA: Diagnosis present

## 2017-05-19 DIAGNOSIS — M818 Other osteoporosis without current pathological fracture: Secondary | ICD-10-CM | POA: Diagnosis not present

## 2017-05-21 ENCOUNTER — Encounter: Payer: Self-pay | Admitting: Internal Medicine

## 2017-05-21 DIAGNOSIS — M810 Age-related osteoporosis without current pathological fracture: Secondary | ICD-10-CM | POA: Insufficient documentation

## 2017-06-02 ENCOUNTER — Telehealth: Payer: Self-pay | Admitting: Internal Medicine

## 2017-06-02 ENCOUNTER — Encounter: Payer: Self-pay | Admitting: Urology

## 2017-06-02 ENCOUNTER — Other Ambulatory Visit: Payer: Self-pay | Admitting: Radiology

## 2017-06-02 ENCOUNTER — Ambulatory Visit
Admission: RE | Admit: 2017-06-02 | Discharge: 2017-06-02 | Disposition: A | Discharge status: Home / Self Care | Payer: BC Managed Care – PPO | Source: Ambulatory Visit | Attending: Urology | Admitting: Urology

## 2017-06-02 ENCOUNTER — Ambulatory Visit: Payer: BC Managed Care – PPO | Admitting: Urology

## 2017-06-02 ENCOUNTER — Ambulatory Visit: Payer: Self-pay | Admitting: Urology

## 2017-06-02 VITALS — BP 106/59 | HR 57 | Ht 61.0 in | Wt 98.0 lb

## 2017-06-02 DIAGNOSIS — M545 Low back pain, unspecified: Secondary | ICD-10-CM

## 2017-06-02 DIAGNOSIS — N200 Calculus of kidney: Secondary | ICD-10-CM

## 2017-06-02 DIAGNOSIS — R3129 Other microscopic hematuria: Secondary | ICD-10-CM

## 2017-06-02 LAB — URINALYSIS, COMPLETE
BILIRUBIN UA: NEGATIVE
Glucose, UA: NEGATIVE
KETONES UA: NEGATIVE
Leukocytes, UA: NEGATIVE
NITRITE UA: NEGATIVE
PROTEIN UA: NEGATIVE
SPEC GRAV UA: 1.01 (ref 1.005–1.030)
Urobilinogen, Ur: 0.2 mg/dL (ref 0.2–1.0)
pH, UA: 7 (ref 5.0–7.5)

## 2017-06-02 LAB — MICROSCOPIC EXAMINATION

## 2017-06-02 NOTE — H&P (View-Only) (Signed)
06/02/2017 9:48 AM   Megan Mcintosh 05/03/1956 [id number]  Referring provider: [name], MD [address] [place], [place] [address]  Chief Complaint  Patient presents with  . Flank Pain    HPI: 62 year old female with a history of nephrolithiasis who presents today with a week of dull left flank pain radiating to her left buttock and posterior thigh.  She is concerned that she may be passing another stone.  She does report that she been doing additional physical activity including lifting heavy boxes helping a family member move.  She does not recall injuring herself.  She describes the pain as dull, aching, and constant.  Exacerbated with movement.    Denies abdominal pain.  No associated urinary symptoms including no dysuria, gross hematuria, or any other urinary complaints.  No nausea or vomiting.  KUB today shows increased conglomeration of left lower pole nonobstructing stones, punctate right stone.  Since her last visit 1 year ago, she had no stone episodes.  She is previously undergone right ureteroscopy, laser lithotripsy in 09/2015 for an obstructing ureteral calculus.   PMH: Past Medical History:  Diagnosis Date  . Arthritis   . Cervical dysplasia 1980   s/p freezing  . Nephrolithiasis 1987  . Pinguecula of right eye    removed by Amado Nash    Surgical History: Past Surgical History:  Procedure Laterality Date  . BREAST BIOPSY Right 2012   benign   . BREAST SURGERY  JAN 2012   right breast , BENIGN Rochel Brome  . CESAREAN SECTION     x 2  . COLONOSCOPY N/A 03/26/2016   Procedure: COLONOSCOPY;  Surgeon: Manya Silvas, MD;  [facility]: [facility] ENDOSCOPY;  Service: Endoscopy;  Laterality: N/A;  . CYSTOSCOPY WITH STENT PLACEMENT Right 09/12/2015   Procedure: CYSTOSCOPY WITH STENT PLACEMENT;  Surgeon: Ardis Hughs, MD;  Location: [facility] ORS;  Service: Urology;  Laterality: Right;  . CYSTOSCOPY/URETEROSCOPY/HOLMIUM LASER/STENT  PLACEMENT Right 09/29/2015   Procedure: CYSTOSCOPY/URETEROSCOPY/HOLMIUM LASER/STENT PLACEMENT/ STENT EXCHANGE;  Surgeon: [name], MD;  Location: [facility] ORS;  Service: Urology;  Laterality: Right;  . FOOT SURGERY  2006   [facility]:  silicone implants in both great toes   . pilonidal  1979   cyst excised   . STONE EXTRACTION WITH BASKET Right 09/29/2015   Procedure: STONE EXTRACTION WITH BASKET;  Surgeon: [name], MD;  Location: [facility] ORS;  Service: Urology;  Laterality: Right;  . TUBAL LIGATION  1990    Home Medications:  Allergies as of 06/02/2017   No Known Allergies     Medication List    as of 06/02/2017 11:59 PM   You have not been prescribed any medications.     Allergies: No Known Allergies  Family History: Family History  Problem Relation Age of Onset  . Aneurysm Mother 24       Hemorrhage CVA  . Stroke Mother   . Urolithiasis Mother   . Kidney disease Mother   . Birth defects Maternal Uncle   . Cancer Neg Hx   . Prostate cancer Neg Hx   . Kidney cancer Neg Hx     Social History:  reports that  has never smoked. she has never used smokeless tobacco. She reports that she drinks alcohol. She reports that she does not use drugs.  ROS: UROLOGY Frequent Urination?: No Hard to postpone urination?: No Burning/pain with urination?: No Get up at night to urinate?: No Leakage of urine?: No Urine stream starts  and stops?: No Trouble starting stream?: No Do you have to strain to urinate?: No Blood in urine?: No Urinary tract infection?: No Sexually transmitted disease?: No Injury to kidneys or bladder?: No Painful intercourse?: No Weak stream?: No Currently pregnant?: No Vaginal bleeding?: No Last menstrual period?: n  Gastrointestinal Nausea?: No Vomiting?: No Indigestion/heartburn?: No Diarrhea?: No Constipation?: No  Constitutional Fever: No Night sweats?: No Weight loss?: No Fatigue?: No  Skin Skin rash/lesions?: No Itching?:  No  Eyes Blurred vision?: No Double vision?: No  Ears/Nose/Throat Sore throat?: No Sinus problems?: No  Hematologic/Lymphatic Swollen glands?: No Easy bruising?: No  Cardiovascular Leg swelling?: No Chest pain?: No  Respiratory Cough?: No Shortness of breath?: No  Endocrine Excessive thirst?: No  Musculoskeletal Back pain?: No Joint pain?: No  Neurological Headaches?: No Dizziness?: No  Psychologic Depression?: No Anxiety?: No  Physical Exam: BP (!) 106/59   Pulse (!) 57   Ht 5\' 1"  (1.549 m)   Wt 98 lb (44.5 kg)   LMP 05/17/2011   BMI 18.52 kg/m   Constitutional:  Alert and oriented, No acute distress.  Thin, athletic. HEENT: [place] AT, moist mucus membranes.  Trachea midline, no masses. Cardiovascular: No clubbing, cyanosis, or edema. Respiratory: Normal respiratory effort, no increased work of breathing. GI: Abdomen is soft, nontender, nondistended, no abdominal masses GU: No CVA tenderness.  Skin: No rashes, bruises or suspicious lesions. Neurologic: Grossly intact, no focal deficits, moving all 4 extremities. Psychiatric: Normal mood and affect.  Laboratory Data: Lab Results  Component Value Date   WBC 7.3 02/15/2017   HGB 15.1 (H) 02/15/2017   HCT 45.1 02/15/2017   MCV 90.9 02/15/2017   PLT 409.0 (H) 02/15/2017    Lab Results  Component Value Date   CREATININE 0.76 02/15/2017   Urinalysis Results for orders placed or performed in visit on 06/02/17  Microscopic Examination  Result Value Ref Range   WBC, UA 0-5 0 - 5 /hpf   RBC, UA 3-10 (A) 0 - 2 /hpf   Epithelial Cells (non renal) 0-10 0 - 10 /hpf   Mucus, UA Present (A) Not Estab.   Bacteria, UA Few (A) None seen/Few  Urinalysis, Complete  Result Value Ref Range   Specific Gravity, UA 1.010 1.005 - 1.030   pH, UA 7.0 5.0 - 7.5   Color, UA Yellow Yellow   Appearance Ur Clear Clear   Leukocytes, UA Negative Negative   Protein, UA Negative Negative/Trace   Glucose, UA Negative  Negative   Ketones, UA Negative Negative   RBC, UA 1+ (A) Negative   Bilirubin, UA Negative Negative   Urobilinogen, Ur 0.2 0.2 - 1.0 mg/dL   Nitrite, UA Negative Negative   Microscopic Examination See below:     Pertinent Imaging: Results for orders placed during the hospital encounter of 06/02/17  DG Abd 1 View   Narrative CLINICAL DATA:  History of kidney stones with left-sided pain, initial encounter  EXAM: ABDOMEN - 1 VIEW  COMPARISON:  04/30/2016  FINDINGS: Scattered large and small bowel gas is seen. Bilateral renal calculi are noted much greater on the left than the right. The stones in the lower pole on the left appear more conglomerate on today's exam. The largest of these measures approximately 5 mm. The largest stone on the right measures approximately 3 mm. No definitive ureteral stones are seen. Multiple pelvic phleboliths are noted. No acute bony abnormality is seen.  IMPRESSION: Bilateral renal calculi. The left lower pole stones are somewhat more  conglomerate than that seen on the prior exam.   Electronically Signed   By: Inez Catalina M.D.   On: 06/02/2017 09:42    KUB personally reviewed today and with the patient.  This was also directly compared to her previous KUB last year.  Assessment & Plan:    1. Acute left-sided low back pain without sciatica Petra Kuba and onset of left back pain radiating to buttock is not consistent with stones Additionally, KUB shows no evidence of obstructing ureteral calculi Suspect musculoskeletal strain NSAIDs as tolerated, heating pad, and rest Advise follow-up with PCP if this fails to improve  2. Nephrolithiasis Left greater than right nonobstructing calculi It appears that there is been some mild progression of her left-sided nonobstructing lower pole stones We discussed options including continued surveillance versus intervention for the stones We discussed various treatment options including ESWL vs.  ureteroscopy, laser lithotripsy, and stent. We discussed the risks and benefits of both including bleeding, infection, damage to surrounding structures, efficacy with need for possible further intervention, and need for temporary ureteral stent. She is interested in prophylactically treating her left-sided stone burden and is interested in shockwave lithotripsy All questions were answered  She is also interested in pursuing 56-CLEX urine metabolic workup once her stones have been treated.  She has been recently diagnosed with osteoporosis advised to start vitamin D supplementation.  She is interested in understanding the impact this will have on her development of kidney stones  - Urinalysis, Complete  3. Microscopic hematuria Incidental microscopic hematuria today Presumably, this is related to stones We will repeat urinalysis after stones been treated, if this persists would recommend microscopic hematuria workup-of note, no bladder pathology was identified at the time of ureteroscopy and 09/2015  Schedule left ESWL  [name], MD  [facility] [address] [place], [place] [address] [phone]

## 2017-06-02 NOTE — Telephone Encounter (Signed)
Copied from [place] [id number]. Topic: Quick Communication - See Telephone Encounter >> Jun 02, 2017 11:38 AM Ivar Drape wrote: CRM for notification. See Telephone encounter for:  06/02/17. Patient was prescribed Vit D but she wants to know what kind and how much to take.

## 2017-06-02 NOTE — Progress Notes (Signed)
06/02/2017 9:48 AM   Ebony Cargo 1956-02-05 [id number]  Referring provider: [name], MD [place], [place] [address]  Chief Complaint  Patient presents with  . Flank Pain    HPI: 62 year old female with a history of nephrolithiasis who presents today with a week of dull left flank pain radiating to her left buttock and posterior thigh.  She is concerned that she may be passing another stone.  She does report that she been doing additional physical activity including lifting heavy boxes helping a family member move.  She does not recall injuring herself.  She describes the pain as dull, aching, and constant.  Exacerbated with movement.    Denies abdominal pain.  No associated urinary symptoms including no dysuria, gross hematuria, or any other urinary complaints.  No nausea or vomiting.  KUB today shows increased conglomeration of left lower pole nonobstructing stones, punctate right stone.  Since her last visit 1 year ago, she had no stone episodes.  She is previously undergone right ureteroscopy, laser lithotripsy in 09/2015 for an obstructing ureteral calculus.   PMH: Past Medical History:  Diagnosis Date  . Arthritis   . Cervical dysplasia 1980   s/p freezing  . Nephrolithiasis 1987  . Pinguecula of right eye    removed by Amado Nash    Surgical History: Past Surgical History:  Procedure Laterality Date  . BREAST BIOPSY Right 2012   benign   . BREAST SURGERY  JAN 2012   right breast , BENIGN Rochel Brome  . CESAREAN SECTION     x 2  . COLONOSCOPY N/A 03/26/2016   Procedure: COLONOSCOPY;  Surgeon: Manya Silvas, MD;  Location: [facility] ENDOSCOPY;  Service: Endoscopy;  Laterality: N/A;  . CYSTOSCOPY WITH STENT PLACEMENT Right 09/12/2015   Procedure: CYSTOSCOPY WITH STENT PLACEMENT;  Surgeon: Ardis Hughs, MD;  Location: [facility] ORS;  Service: Urology;  Laterality: Right;  . CYSTOSCOPY/URETEROSCOPY/HOLMIUM LASER/STENT  PLACEMENT Right 09/29/2015   Procedure: CYSTOSCOPY/URETEROSCOPY/HOLMIUM LASER/STENT PLACEMENT/ STENT EXCHANGE;  Surgeon: [name], MD;  Location: [facility] ORS;  Service: Urology;  Laterality: Right;  . FOOT SURGERY  2006   [facility]:  silicone implants in both great toes   . pilonidal  1979   cyst excised   . STONE EXTRACTION WITH BASKET Right 09/29/2015   Procedure: STONE EXTRACTION WITH BASKET;  Surgeon: [name], MD;  Location: [facility] ORS;  Service: Urology;  Laterality: Right;  . TUBAL LIGATION  1990    Home Medications:  Allergies as of 06/02/2017   No Known Allergies     Medication List    as of 06/02/2017 11:59 PM   You have not been prescribed any medications.     Allergies: No Known Allergies  Family History: Family History  Problem Relation Age of Onset  . Aneurysm Mother 36       Hemorrhage CVA  . Stroke Mother   . Urolithiasis Mother   . Kidney disease Mother   . Birth defects Maternal Uncle   . Cancer Neg Hx   . Prostate cancer Neg Hx   . Kidney cancer Neg Hx     Social History:  reports that  has never smoked. she has never used smokeless tobacco. She reports that she drinks alcohol. She reports that she does not use drugs.  ROS: UROLOGY Frequent Urination?: No Hard to postpone urination?: No Burning/pain with urination?: No Get up at night to urinate?: No Leakage of urine?: No Urine stream starts  and stops?: No Trouble starting stream?: No Do you have to strain to urinate?: No Blood in urine?: No Urinary tract infection?: No Sexually transmitted disease?: No Injury to kidneys or bladder?: No Painful intercourse?: No Weak stream?: No Currently pregnant?: No Vaginal bleeding?: No Last menstrual period?: n  Gastrointestinal Nausea?: No Vomiting?: No Indigestion/heartburn?: No Diarrhea?: No Constipation?: No  Constitutional Fever: No Night sweats?: No Weight loss?: No Fatigue?: No  Skin Skin rash/lesions?: No Itching?:  No  Eyes Blurred vision?: No Double vision?: No  Ears/Nose/Throat Sore throat?: No Sinus problems?: No  Hematologic/Lymphatic Swollen glands?: No Easy bruising?: No  Cardiovascular Leg swelling?: No Chest pain?: No  Respiratory Cough?: No Shortness of breath?: No  Endocrine Excessive thirst?: No  Musculoskeletal Back pain?: No Joint pain?: No  Neurological Headaches?: No Dizziness?: No  Psychologic Depression?: No Anxiety?: No  Physical Exam: BP (!) 106/59   Pulse (!) 57   Ht 5\' 1"  (1.549 m)   Wt 98 lb (44.5 kg)   LMP 05/17/2011   BMI 18.52 kg/m   Constitutional:  Alert and oriented, No acute distress.  Thin, athletic. HEENT: [place] AT, moist mucus membranes.  Trachea midline, no masses. Cardiovascular: No clubbing, cyanosis, or edema. Respiratory: Normal respiratory effort, no increased work of breathing. GI: Abdomen is soft, nontender, nondistended, no abdominal masses GU: No CVA tenderness.  Skin: No rashes, bruises or suspicious lesions. Neurologic: Grossly intact, no focal deficits, moving all 4 extremities. Psychiatric: Normal mood and affect.  Laboratory Data: Lab Results  Component Value Date   WBC 7.3 02/15/2017   HGB 15.1 (H) 02/15/2017   HCT 45.1 02/15/2017   MCV 90.9 02/15/2017   PLT 409.0 (H) 02/15/2017    Lab Results  Component Value Date   CREATININE 0.76 02/15/2017   Urinalysis Results for orders placed or performed in visit on 06/02/17  Microscopic Examination  Result Value Ref Range   WBC, UA 0-5 0 - 5 /hpf   RBC, UA 3-10 (A) 0 - 2 /hpf   Epithelial Cells (non renal) 0-10 0 - 10 /hpf   Mucus, UA Present (A) Not Estab.   Bacteria, UA Few (A) None seen/Few  Urinalysis, Complete  Result Value Ref Range   Specific Gravity, UA 1.010 1.005 - 1.030   pH, UA 7.0 5.0 - 7.5   Color, UA Yellow Yellow   Appearance Ur Clear Clear   Leukocytes, UA Negative Negative   Protein, UA Negative Negative/Trace   Glucose, UA Negative  Negative   Ketones, UA Negative Negative   RBC, UA 1+ (A) Negative   Bilirubin, UA Negative Negative   Urobilinogen, Ur 0.2 0.2 - 1.0 mg/dL   Nitrite, UA Negative Negative   Microscopic Examination See below:     Pertinent Imaging: Results for orders placed during the hospital encounter of 06/02/17  DG Abd 1 View   Narrative CLINICAL DATA:  History of kidney stones with left-sided pain, initial encounter  EXAM: ABDOMEN - 1 VIEW  COMPARISON:  04/30/2016  FINDINGS: Scattered large and small bowel gas is seen. Bilateral renal calculi are noted much greater on the left than the right. The stones in the lower pole on the left appear more conglomerate on today's exam. The largest of these measures approximately 5 mm. The largest stone on the right measures approximately 3 mm. No definitive ureteral stones are seen. Multiple pelvic phleboliths are noted. No acute bony abnormality is seen.  IMPRESSION: Bilateral renal calculi. The left lower pole stones are somewhat more  conglomerate than that seen on the prior exam.   Electronically Signed   By: Inez Catalina M.D.   On: 06/02/2017 09:42    KUB personally reviewed today and with the patient.  This was also directly compared to her previous KUB last year.  Assessment & Plan:    1. Acute left-sided low back pain without sciatica Petra Kuba and onset of left back pain radiating to buttock is not consistent with stones Additionally, KUB shows no evidence of obstructing ureteral calculi Suspect musculoskeletal strain NSAIDs as tolerated, heating pad, and rest Advise follow-up with PCP if this fails to improve  2. Nephrolithiasis Left greater than right nonobstructing calculi It appears that there is been some mild progression of her left-sided nonobstructing lower pole stones We discussed options including continued surveillance versus intervention for the stones We discussed various treatment options including ESWL vs.  ureteroscopy, laser lithotripsy, and stent. We discussed the risks and benefits of both including bleeding, infection, damage to surrounding structures, efficacy with need for possible further intervention, and need for temporary ureteral stent. She is interested in prophylactically treating her left-sided stone burden and is interested in shockwave lithotripsy All questions were answered  She is also interested in pursuing 72-BMSX urine metabolic workup once her stones have been treated.  She has been recently diagnosed with osteoporosis advised to start vitamin D supplementation.  She is interested in understanding the impact this will have on her development of kidney stones  - Urinalysis, Complete  3. Microscopic hematuria Incidental microscopic hematuria today Presumably, this is related to stones We will repeat urinalysis after stones been treated, if this persists would recommend microscopic hematuria workup-of note, no bladder pathology was identified at the time of ureteroscopy and 09/2015  Schedule left ESWL  [name], MD  [facility] [address], [place], [place] [address] [phone]

## 2017-06-06 NOTE — Telephone Encounter (Signed)
Pt has not had her vitamin d level drawn since 01/2017. Do you pt to just take OTC vitamin d?

## 2017-06-06 NOTE — Telephone Encounter (Signed)
My instructions were  Outlined in the September lab results mychart message, PLEASE READ TO PATIENT

## 2017-06-06 NOTE — Telephone Encounter (Signed)
Spoke with pt and she stated that she is taking the calcium vitamin d capsules that equal to her taking 2000 IUs of vitamin d daily.

## 2017-06-14 ENCOUNTER — Encounter: Payer: Self-pay | Admitting: *Deleted

## 2017-06-14 ENCOUNTER — Ambulatory Visit: Payer: Self-pay | Admitting: Urology

## 2017-06-15 MED ORDER — CIPROFLOXACIN HCL 500 MG PO TABS
500.0000 mg | ORAL_TABLET | ORAL | Status: AC
  Administered 2017-06-16: 500 mg via ORAL

## 2017-06-16 ENCOUNTER — Encounter
Admission: RE | Disposition: A | Discharge status: Home / Self Care | Payer: Self-pay | Source: Ambulatory Visit | Attending: Urology

## 2017-06-16 ENCOUNTER — Encounter: Payer: Self-pay | Admitting: *Deleted

## 2017-06-16 ENCOUNTER — Ambulatory Visit: Payer: BC Managed Care – PPO

## 2017-06-16 ENCOUNTER — Ambulatory Visit
Admission: RE | Admit: 2017-06-16 | Discharge: 2017-06-16 | Disposition: A | Discharge status: Home / Self Care | Payer: BC Managed Care – PPO | Source: Ambulatory Visit | Attending: Urology | Admitting: Urology

## 2017-06-16 ENCOUNTER — Other Ambulatory Visit: Payer: Self-pay

## 2017-06-16 DIAGNOSIS — M810 Age-related osteoporosis without current pathological fracture: Secondary | ICD-10-CM | POA: Insufficient documentation

## 2017-06-16 DIAGNOSIS — M1990 Unspecified osteoarthritis, unspecified site: Secondary | ICD-10-CM | POA: Diagnosis not present

## 2017-06-16 DIAGNOSIS — N200 Calculus of kidney: Secondary | ICD-10-CM

## 2017-06-16 HISTORY — DX: Personal history of urinary calculi: Z87.442

## 2017-06-16 HISTORY — DX: Vitamin D deficiency, unspecified: E55.9

## 2017-06-16 HISTORY — DX: Other specified disorders of bone density and structure, unspecified thigh: M85.859

## 2017-06-16 HISTORY — DX: Fracture of unspecified carpal bone, unspecified wrist, initial encounter for closed fracture: S62.109A

## 2017-06-16 HISTORY — PX: EXTRACORPOREAL SHOCK WAVE LITHOTRIPSY: SHX1557

## 2017-06-16 HISTORY — DX: Pilonidal cyst without abscess: L05.91

## 2017-06-16 HISTORY — DX: Pure hypercholesterolemia, unspecified: E78.00

## 2017-06-16 HISTORY — DX: Unspecified lump in the right breast, unspecified quadrant: N63.10

## 2017-06-16 HISTORY — DX: Other fatigue: R53.83

## 2017-06-16 SURGERY — LITHOTRIPSY, ESWL
Anesthesia: Moderate Sedation | Laterality: Left

## 2017-06-16 MED ORDER — SODIUM CHLORIDE 0.9 % IV SOLN: INTRAVENOUS | Status: DC

## 2017-06-16 MED ORDER — DIAZEPAM 5 MG PO TABS
ORAL_TABLET | ORAL | Status: AC
  Filled 2017-06-16: qty 2

## 2017-06-16 MED ORDER — HYDROCODONE-ACETAMINOPHEN 5-325 MG PO TABS: 1.0000 | ORAL_TABLET | ORAL | 0 refills | Status: DC | PRN

## 2017-06-16 MED ORDER — CIPROFLOXACIN HCL 500 MG PO TABS
ORAL_TABLET | ORAL | Status: AC
  Filled 2017-06-16: qty 1

## 2017-06-16 MED ORDER — DIAZEPAM 5 MG PO TABS
10.0000 mg | ORAL_TABLET | ORAL | Status: AC
  Administered 2017-06-16: 10 mg via ORAL

## 2017-06-16 MED ORDER — ONDANSETRON HCL 4 MG/2ML IJ SOLN
INTRAMUSCULAR | Status: AC
  Filled 2017-06-16: qty 2

## 2017-06-16 MED ORDER — DIPHENHYDRAMINE HCL 25 MG PO CAPS
25.0000 mg | ORAL_CAPSULE | ORAL | Status: AC
  Administered 2017-06-16: 25 mg via ORAL

## 2017-06-16 MED ORDER — DIPHENHYDRAMINE HCL 25 MG PO CAPS
ORAL_CAPSULE | ORAL | Status: AC
  Filled 2017-06-16: qty 1

## 2017-06-16 MED ORDER — ONDANSETRON HCL 4 MG/2ML IJ SOLN
4.0000 mg | Freq: Once | INTRAMUSCULAR | Status: AC
  Administered 2017-06-16: 4 mg via INTRAVENOUS

## 2017-06-16 NOTE — Interval H&P Note (Signed)
History and Physical Interval Note:  06/16/2017 7:47 AM  Megan Mcintosh  has presented today for surgery, with the diagnosis of Kidney stone  The various methods of treatment have been discussed with the patient and family. After consideration of risks, benefits and other options for treatment, the patient has consented to  Procedure(s): EXTRACORPOREAL SHOCK WAVE LITHOTRIPSY (ESWL) (Left) as a surgical intervention .  The patient's history has been reviewed, patient examined, no change in status, stable for surgery.  I have reviewed the patient's chart and labs.  Questions were answered to the patient's satisfaction.     [place]

## 2017-06-16 NOTE — Discharge Instructions (Signed)
Dietary Guidelines to Help Prevent Kidney Stones Kidney stones are deposits of minerals and salts that form inside your kidneys. Your risk of developing kidney stones may be greater depending on your diet, your lifestyle, the medicines you take, and whether you have certain medical conditions. Most people can reduce their chances of developing kidney stones by following the instructions below. Depending on your overall health and the type of kidney stones you tend to develop, your dietitian may give you more specific instructions. What are tips for following this plan? Reading food labels   Choose foods with "no salt added" or "low-salt" labels. Limit your sodium intake to less than 1500 mg per day.  Choose foods with calcium for each meal and snack. Try to eat about 300 mg of calcium at each meal. Foods that contain 200-500 mg of calcium per serving include:  8 oz (237 ml) of milk, fortified nondairy milk, and fortified fruit juice.  8 oz (237 ml) of kefir, yogurt, and soy yogurt.  4 oz (118 ml) of tofu.  1 oz of cheese.  1 cup (300 g) of dried figs.  1 cup (91 g) of cooked broccoli.  1-3 oz can of sardines or mackerel.  Most people need 1000 to 1500 mg of calcium each day. Talk to your dietitian about how much calcium is recommended for you. Shopping   Buy plenty of fresh fruits and vegetables. Most people do not need to avoid fruits and vegetables, even if they contain nutrients that may contribute to kidney stones.  When shopping for convenience foods, choose:  Whole pieces of fruit.  Premade salads with dressing on the side.  Low-fat fruit and yogurt smoothies.  Avoid buying frozen meals or prepared deli foods.  Look for foods with live cultures, such as yogurt and kefir. Cooking   Do not add salt to food when cooking. Place a salt shaker on the table and allow each person to add his or her own salt to taste.  Use vegetable protein, such as beans, textured vegetable  protein (TVP), or tofu instead of meat in pasta, casseroles, and soups. Meal planning   Eat less salt, if told by your dietitian. To do this:  Avoid eating processed or premade food.  Avoid eating fast food.  Eat less animal protein, including cheese, meat, poultry, or fish, if told by your dietitian. To do this:  Limit the number of times you have meat, poultry, fish, or cheese each week. Eat a diet free of meat at least 2 days a week.  Eat only one serving each day of meat, poultry, fish, or seafood.  When you prepare animal protein, cut pieces into small portion sizes. For most meat and fish, one serving is about the size of one deck of cards.  Eat at least 5 servings of fresh fruits and vegetables each day. To do this:  Keep fruits and vegetables on hand for snacks.  Eat 1 piece of fruit or a handful of berries with breakfast.  Have a salad and fruit at lunch.  Have two kinds of vegetables at dinner.  Limit foods that are high in a substance called oxalate. These include:  Spinach.  Rhubarb.  Beets.  Potato chips and french fries.  Nuts.  If you regularly take a diuretic medicine, make sure to eat at least 1-2 fruits or vegetables high in potassium each day. These include:  Avocado.  Banana.  Orange, prune, carrot, or tomato juice.  Baked potato.  Cabbage.    General instructions  Drink enough fluid to keep your urine clear or pale yellow. This is the most important thing you can do.  Talk to your health care provider and dietitian about taking daily supplements. Depending on your health and the cause of your kidney stones, you may be advised: ? Not to take supplements with vitamin C. ? To take a calcium supplement. ? To take a daily probiotic supplement. ? To take other supplements such as magnesium, fish oil, or vitamin B6.  Take all medicines and supplements as told by your health care provider.  Limit alcohol intake to no more  than 1 drink a day for nonpregnant women and 2 drinks a day for men. One drink equals 12 oz of beer, 5 oz of wine, or 1 oz of hard liquor.  Lose weight if told by your health care provider. Work with your dietitian to find strategies and an eating plan that works best for you. What foods are not recommended? Limit your intake of the following foods, or as told by your dietitian. Talk to your dietitian about specific foods you should avoid based on the type of kidney stones and your overall health. Grains Breads. Bagels. Rolls. Baked goods. Salted crackers. Cereal. Pasta. Vegetables Spinach. Rhubarb. Beets. Canned vegetables. Angie Fava. Olives. Meats and other protein foods Nuts. Nut butters. Large portions of meat, poultry, or fish. Salted or cured meats. Deli meats. Hot dogs. Sausages. Dairy Cheese. Beverages Regular soft drinks. Regular vegetable juice. Seasonings and other foods Seasoning blends with salt. Salad dressings. Canned soups. Soy sauce. Ketchup. Barbecue sauce. Canned pasta sauce. Casseroles. Pizza. Lasagna. Frozen meals. Potato chips. [place] fries. Summary  You can reduce your risk of kidney stones by making changes to your diet.  The most important thing you can do is drink enough fluid. You should drink enough fluid to keep your urine clear or pale yellow.  Ask your health care provider or dietitian how much protein from animal sources you should eat each day, and also how much salt and calcium you should have each day. This information is not intended to replace advice given to you by your health care provider. Make sure you discuss any questions you have with your health care provider. Document Released: 09/04/2010 Document Revised: 04/20/2016 Document Reviewed: 04/20/2016 Elsevier Interactive Patient Education  2018 [place]   1) The drugs that you were given will stay in your system until tomorrow so for the next 24  hours you should not:  A) Drive an automobile B) Make any legal decisions C) Drink any alcoholic beverage   2) You may resume regular meals tomorrow.  Today it is better to start with liquids and gradually work up to solid foods.  You may eat anything you prefer, but it is better to start with liquids, then soup and crackers, and gradually work up to solid foods.   3) Please notify your doctor immediately if you have any unusual bleeding, trouble breathing, redness and pain at the surgery site, drainage, fever, or pain not relieved by medication.    4) Additional Instructions:        Please contact your physician with any problems or Same Day Surgery at [phone], Monday through Friday 6 am to 4 pm, or  at [facility] number at [phone].

## 2017-06-17 ENCOUNTER — Encounter: Payer: Self-pay | Admitting: Urology

## 2017-06-24 ENCOUNTER — Emergency Department
Admission: EM | Admit: 2017-06-24 | Discharge: 2017-06-24 | Disposition: A | Discharge status: Home / Self Care | Payer: BC Managed Care – PPO | Attending: Emergency Medicine | Admitting: Emergency Medicine

## 2017-06-24 ENCOUNTER — Encounter: Payer: Self-pay | Admitting: Emergency Medicine

## 2017-06-24 ENCOUNTER — Other Ambulatory Visit: Payer: Self-pay

## 2017-06-24 ENCOUNTER — Emergency Department: Payer: BC Managed Care – PPO

## 2017-06-24 DIAGNOSIS — R319 Hematuria, unspecified: Secondary | ICD-10-CM | POA: Insufficient documentation

## 2017-06-24 DIAGNOSIS — E7800 Pure hypercholesterolemia, unspecified: Secondary | ICD-10-CM | POA: Insufficient documentation

## 2017-06-24 DIAGNOSIS — R1084 Generalized abdominal pain: Secondary | ICD-10-CM | POA: Diagnosis present

## 2017-06-24 DIAGNOSIS — N23 Unspecified renal colic: Secondary | ICD-10-CM | POA: Diagnosis not present

## 2017-06-24 DIAGNOSIS — Z79899 Other long term (current) drug therapy: Secondary | ICD-10-CM | POA: Diagnosis not present

## 2017-06-24 LAB — URINALYSIS, ROUTINE W REFLEX MICROSCOPIC
Bilirubin Urine: NEGATIVE
Glucose, UA: NEGATIVE mg/dL
KETONES UR: 20 mg/dL — AB
Nitrite: NEGATIVE
PROTEIN: 30 mg/dL — AB
Specific Gravity, Urine: 1.03 (ref 1.005–1.030)
pH: 5 (ref 5.0–8.0)

## 2017-06-24 LAB — CBC WITH DIFFERENTIAL/PLATELET
BASOS ABS: 0 10*3/uL (ref 0–0.1)
BASOS PCT: 0 %
Eosinophils Absolute: 0 10*3/uL (ref 0–0.7)
Eosinophils Relative: 0 %
HEMATOCRIT: 38.1 % (ref 35.0–47.0)
HEMOGLOBIN: 12.9 g/dL (ref 12.0–16.0)
LYMPHS PCT: 4 %
Lymphs Abs: 0.5 10*3/uL — ABNORMAL LOW (ref 1.0–3.6)
MCH: 29.8 pg (ref 26.0–34.0)
MCHC: 33.7 g/dL (ref 32.0–36.0)
MCV: 88.3 fL (ref 80.0–100.0)
Monocytes Absolute: 0.5 10*3/uL (ref 0.2–0.9)
Monocytes Relative: 4 %
NEUTROS ABS: 10.4 10*3/uL — AB (ref 1.4–6.5)
NEUTROS PCT: 92 %
Platelets: 384 10*3/uL (ref 150–440)
RBC: 4.32 MIL/uL (ref 3.80–5.20)
RDW: 13 % (ref 11.5–14.5)
WBC: 11.3 10*3/uL — ABNORMAL HIGH (ref 3.6–11.0)

## 2017-06-24 LAB — BASIC METABOLIC PANEL
ANION GAP: 9 (ref 5–15)
BUN: 29 mg/dL — ABNORMAL HIGH (ref 6–20)
CHLORIDE: 105 mmol/L (ref 101–111)
CO2: 27 mmol/L (ref 22–32)
Calcium: 9.2 mg/dL (ref 8.9–10.3)
Creatinine, Ser: 0.86 mg/dL (ref 0.44–1.00)
GFR calc non Af Amer: >60 mL/min (ref >60)
Glucose, Bld: 116 mg/dL — ABNORMAL HIGH (ref 65–99)
Potassium: 3.7 mmol/L (ref 3.5–5.1)
Sodium: 141 mmol/L (ref 135–145)

## 2017-06-24 MED ORDER — MORPHINE SULFATE (PF) 2 MG/ML IV SOLN
INTRAVENOUS | Status: AC
  Administered 2017-06-24: 4 mg via INTRAVASCULAR
  Filled 2017-06-24: qty 2

## 2017-06-24 MED ORDER — DOCUSATE SODIUM 100 MG PO CAPS: ORAL_CAPSULE | ORAL | 0 refills | Status: DC

## 2017-06-24 MED ORDER — ONDANSETRON 4 MG PO TBDP: ORAL_TABLET | ORAL | 0 refills | Status: DC

## 2017-06-24 MED ORDER — TAMSULOSIN HCL 0.4 MG PO CAPS: ORAL_CAPSULE | ORAL | 0 refills | Status: DC

## 2017-06-24 MED ORDER — OXYCODONE-ACETAMINOPHEN 5-325 MG PO TABS: 1.0000 | ORAL_TABLET | Freq: Four times a day (QID) | ORAL | 0 refills | Status: DC | PRN

## 2017-06-24 MED ORDER — ONDANSETRON HCL 4 MG/2ML IJ SOLN
INTRAMUSCULAR | Status: AC
  Administered 2017-06-24: 4 mg
  Filled 2017-06-24: qty 2

## 2017-06-24 MED ORDER — SODIUM CHLORIDE 0.9 % IV BOLUS (SEPSIS)
1000.0000 mL | INTRAVENOUS | Status: AC
  Administered 2017-06-24: 1000 mL via INTRAVENOUS

## 2017-06-24 MED ORDER — KETOROLAC TROMETHAMINE 30 MG/ML IJ SOLN
15.0000 mg | Freq: Once | INTRAMUSCULAR | Status: AC
  Administered 2017-06-24: 15 mg via INTRAVENOUS
  Filled 2017-06-24: qty 1

## 2017-06-24 MED ORDER — CEPHALEXIN 500 MG PO CAPS: 500.0000 mg | ORAL_CAPSULE | Freq: Two times a day (BID) | ORAL | 0 refills | Status: DC

## 2017-06-24 MED ORDER — CEPHALEXIN 500 MG PO CAPS
500.0000 mg | ORAL_CAPSULE | Freq: Once | ORAL | Status: AC
  Administered 2017-06-24: 500 mg via ORAL
  Filled 2017-06-24: qty 1

## 2017-06-24 NOTE — Discharge Instructions (Signed)
You have been seen in the Emergency Department (ED) today for pain that we believe based on your workup, is caused by some small kidney stones that you are continuing to pass.  As we have discussed, please drink plenty of fluids.  Please make a follow up appointment with the physician(s) listed elsewhere in this documentation.  It is not clear from your urinalysis whether or not you have a urinary tract infection, but to err on the safe side, I prescribed for you a course of antibiotics.  Please take the full course of treatment unless your doctor tells you otherwise.  You may take pain medication as needed but ONLY as prescribed.  Please also take your prescribed Flomax daily.  We also recommend that you take over-the-counter ibuprofen regularly according to label instructions over the next 5 days.  Take it with meals to minimize stomach discomfort.  Please see your doctor as soon as possible as stones may take 1-3 weeks to pass and you may require additional care or medications.  Do not drink alcohol, drive or participate in any other potentially dangerous activities while taking opiate pain medication as it may make you sleepy. Do not take this medication with any other sedating medications, either prescription or over-the-counter. If you were prescribed Percocet or Vicodin, do not take these with acetaminophen (Tylenol) as it is already contained within these medications.   Take Percocet as needed for severe pain.  This medication is an opiate (or narcotic) pain medication and can be habit forming.  Use it as little as possible to achieve adequate pain control.  Do not use or use it with extreme caution if you have a history of opiate abuse or dependence.  If you are on a pain contract with your primary care doctor or a pain specialist, be sure to let them know you were prescribed this medication today from the [facility] Emergency Department.  This medication is intended for your use only - do  not give any to anyone else and keep it in a secure place where nobody else, especially children, have access to it.  It will also cause or worsen constipation, so you may want to consider taking an over-the-counter stool softener while you are taking this medication.  Return to the Emergency Department (ED) or call your doctor if you have any worsening pain, fever, painful urination, are unable to urinate, or develop other symptoms that concern you.

## 2017-06-24 NOTE — ED Provider Notes (Signed)
[facility] Emergency Department Provider Note  ____________________________________________   First MD Initiated Contact with Patient 06/24/17 [phone]     (approximate)  I have reviewed the triage vital signs and the nursing notes.   HISTORY  Chief Complaint Flank Pain    HPI Megan Mcintosh is a 62 y.o. female with a history that includes kidney stones and who had lithotripsy by Dr. Bernardo Heater about a week ago.  She presents by private vehicle tonight for evaluation of acute onset severe sharp stabbing left flank pain with nausea and vomiting consistent with prior episodes of renal colic.  She has not seen any blood in her urine.  She denies fever/chills, chest pain, shortness of breath.  The pain in her left flank radiates around to the front of her abdomen.  It is severe and nothing is making it better and she cannot find a position of comfort.  She does not have any dysuria.  She tried hydrocodone at home but it did not help.  She does not have any nausea medicine at home.  Dr. Erlene Quan is her regular urologist by Dr. Bernardo Heater was the one that did the procedure.  Past Medical History:  Diagnosis Date  . Arthritis   . Breast mass, right    benign  . Cervical dysplasia 1980   s/p freezing  . Fatigue   . History of kidney stones 2017, 2019  . Hypercholesteremia   . Nephrolithiasis 1987  . Osteopenia of femoral neck    left  . Pilonidal cyst   . Pinguecula of right eye    removed by Amado Nash  . Vitamin D deficiency 2018  . Wrist fracture 1967    Patient Active Problem List   Diagnosis Date Noted  . Osteoporosis of femur without pathological fracture 05/21/2017  . Impaired fasting glucose 02/19/2017  . Vitamin D deficiency 02/19/2017  . Calculus of kidney 09/03/2014  . Visit for preventive health examination 12/12/2012  . Encounter for general adult medical examination without abnormal findings 12/12/2012  . 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  Cervical dysplasia   . Nephrolithiasis     Past Surgical History:  Procedure Laterality Date  . BREAST BIOPSY Right 2012   benign   . BREAST SURGERY  JAN 2012   right breast , BENIGN Rochel Brome  . CESAREAN SECTION     x 2  . COLONOSCOPY N/A 03/26/2016   Procedure: COLONOSCOPY;  Surgeon: Manya Silvas, MD;  Location: [facility] ENDOSCOPY;  Service: Endoscopy;  Laterality: N/A;  . CYSTOSCOPY WITH STENT PLACEMENT Right 09/12/2015   Procedure: CYSTOSCOPY WITH STENT PLACEMENT;  Surgeon: Ardis Hughs, MD;  Location: [facility] ORS;  Service: Urology;  Laterality: Right;  . CYSTOSCOPY/URETEROSCOPY/HOLMIUM LASER/STENT PLACEMENT Right 09/29/2015   Procedure: CYSTOSCOPY/URETEROSCOPY/HOLMIUM LASER/STENT PLACEMENT/ STENT EXCHANGE;  Surgeon: [name], MD;  Location: [facility] ORS;  Service: Urology;  Laterality: Right;  . EXTRACORPOREAL SHOCK WAVE LITHOTRIPSY Left 06/16/2017   Procedure: EXTRACORPOREAL SHOCK WAVE LITHOTRIPSY (ESWL);  Surgeon: [name], MD;  Location: [facility] ORS;  Service: Urology;  Laterality: Left;  . FOOT SURGERY  2006   [facility]:  silicone implants in both great toes   . FOOT SURGERY Bilateral 3295   silicone implants both great toes  . pilonidal  1979   cyst excised   . STONE EXTRACTION WITH BASKET Right 09/29/2015   Procedure: STONE EXTRACTION WITH BASKET;  Surgeon: [name], MD;  Location: [facility] ORS;  Service: Urology;  Laterality: Right;  .  TUBAL LIGATION  1990    Prior to Admission medications   Medication Sig Start Date End Date Taking? Authorizing Provider  calcium-vitamin D (OSCAL WITH D) 500-200 MG-UNIT tablet Take 1 tablet by mouth daily with breakfast.   Yes [provider]  HYDROcodone-acetaminophen (NORCO/VICODIN) 5-325 MG tablet Take 1 tablet by mouth every 4 (four) hours as needed for moderate pain. 06/16/17  Yes Stoioff, Ronda Fairly, MD  cephALEXin (KEFLEX) 500 MG capsule Take 1 capsule (500 mg total) by mouth 2 (two) times daily. 06/24/17   Hinda Kehr, MD  docusate sodium (COLACE) 100 MG capsule Take 1 tablet once or twice daily as needed for constipation while taking narcotic pain medicine 06/24/17   Hinda Kehr, MD  ondansetron (ZOFRAN ODT) 4 MG disintegrating tablet Allow 1-2 tablets to dissolve in your mouth every 8 hours as needed for nausea/vomiting 06/24/17   Hinda Kehr, MD  oxyCODONE-acetaminophen (PERCOCET) 5-325 MG tablet Take 1-2 tablets by mouth every 6 (six) hours as needed for severe pain. 06/24/17   Hinda Kehr, MD  tamsulosin (FLOMAX) 0.4 MG CAPS capsule Take 1 tablet by mouth daily until you pass the kidney stone or no longer have symptoms 06/24/17   Hinda Kehr, MD    Allergies Patient has no known allergies.  Family History  Problem Relation Age of Onset  . Aneurysm Mother 66       Hemorrhage CVA  . Stroke Mother   . Urolithiasis Mother   . Kidney disease Mother   . Birth defects Maternal Uncle   . Cancer Neg Hx   . Prostate cancer Neg Hx   . Kidney cancer Neg Hx     Social History Social History   Tobacco Use  . Smoking status: Never Smoker  . Smokeless tobacco: Never Used  Substance Use Topics  . Alcohol use: Yes    Comment: occasionally on weekends  . Drug use: No    Review of Systems Constitutional: No fever/chills Eyes: No visual changes. ENT: No sore throat. Cardiovascular: Denies chest pain. Respiratory: Denies shortness of breath. Gastrointestinal: Acute onset severe left flank pain radiating to the left side of her abdomen with nausea and vomiting. Genitourinary: Negative for dysuria.  No gross hematuria. Musculoskeletal: Negative for neck pain.  Negative for back pain. Integumentary: Negative for rash. Neurological: Negative for headaches, focal weakness or numbness.   ____________________________________________   PHYSICAL EXAM:  VITAL SIGNS: ED Triage Vitals  Enc Vitals Group     BP 06/24/17 0204 111/60     Pulse Rate 06/24/17 0204 63     Resp 06/24/17 0204 18     Temp  06/24/17 0204 (!) 97.5 F (36.4 C)     Temp Source 06/24/17 0204 Oral     SpO2 06/24/17 0204 100 %     Weight 06/24/17 0203 44.5 kg (98 lb)     Height 06/24/17 0203 1.524 m (5')     Head Circumference --      Peak Flow --      Pain Score 06/24/17 0235 10     Pain Loc --      Pain Edu? --      Excl. in [place]? --     Constitutional: Alert and oriented.  She received morphine prior to my evaluation, so at the time I saw her she is much more comfortable Eyes: Conjunctivae are normal.  Head: Atraumatic. Nose: No congestion/rhinnorhea. Mouth/Throat: Mucous membranes are moist. Neck: No stridor.  No meningeal signs.   Cardiovascular:  Normal rate, regular rhythm. Good peripheral circulation. Grossly normal heart sounds. Respiratory: Normal respiratory effort.  No retractions. Lungs CTAB. Gastrointestinal: Soft and nontender. No distention.  Mild left CVA tenderness. Musculoskeletal: No lower extremity tenderness nor edema. No gross deformities of extremities. Neurologic:  Normal speech and language. No gross focal neurologic deficits are appreciated.  Skin:  Skin is warm, dry and intact. No rash noted. Psychiatric: Mood and affect are normal. Speech and behavior are normal.  ____________________________________________   LABS (all labs ordered are listed, but only abnormal results are displayed)  Labs Reviewed  URINALYSIS, ROUTINE W REFLEX MICROSCOPIC - Abnormal; Notable for the following components:      Result Value   Color, Urine YELLOW (*)    APPearance CLOUDY (*)    Hgb urine dipstick LARGE (*)    Ketones, ur 20 (*)    Protein, ur 30 (*)    Leukocytes, UA MODERATE (*)    Bacteria, UA RARE (*)    Squamous Epithelial / LPF 0-5 (*)    All other components within normal limits  CBC WITH DIFFERENTIAL/PLATELET - Abnormal; Notable for the following components:   WBC 11.3 (*)    Neutro Abs 10.4 (*)    Lymphs Abs 0.5 (*)    All other components within normal limits  BASIC METABOLIC  PANEL - Abnormal; Notable for the following components:   Glucose, Bld 116 (*)    BUN 29 (*)    All other components within normal limits  URINE CULTURE   ____________________________________________  EKG  None - EKG not ordered by ED physician ____________________________________________  RADIOLOGY   ED MD interpretation: I do not appreciate any acute abnormalities on her 2 view abdomen series including no evidence of ureterolithiasis or free air  Official radiology report(s): Dg Abdomen 1 View  Result Date: 06/24/2017 CLINICAL DATA:  Left flank pain. Indeterminate gas collection on supine abdomen. EXAM: ABDOMEN - 1 VIEW COMPARISON:  06/24/2016 FINDINGS: Left cubital view the abdomen demonstrates no evidence of free intra-abdominal air. The gas collection noted on previous examination is consistent with colonic gas. No abnormal air-fluid levels. IMPRESSION: No free intra-abdominal air or abnormal air-fluid levels on decubitus view of the abdomen. Electronically Signed   By: Lucienne Capers M.D.   On: 06/24/2017 03:33   Dg Abdomen 1 View  Result Date: 06/24/2017 CLINICAL DATA:  Patient states she awoke at 11pm having left flank pain; lithotripsy 1 week ago EXAM: ABDOMEN - 1 VIEW COMPARISON:  Plain film of the abdomen dated 06/16/2017. FINDINGS: Again noted is bilateral nephrolithiasis, partially obscured by overlying bowel gas and stool. Calcifications within the lower pelvis are stable, presumably chronic phleboliths. Collection of air within the left abdomen is likely within bowel, but slightly atypical configuration, most likely overlapping bowel loops. No dilated large or small bowel loops seen identified. No evidence of free intraperitoneal air. Lung bases are clear. Osseous structures are unremarkable. IMPRESSION: 1. Collection of air within the left abdomen is likely within bowel, although slightly atypical configuration, most likely related to overlapping bowel loops. Recommend  confirmation with additional upright or decubitus plain film of the abdomen. 2. Nonobstructive bowel gas pattern. 3. Bilateral nephrolithiasis. Electronically Signed   By: Franki Cabot M.D.   On: 06/24/2017 02:44    ____________________________________________   PROCEDURES  Critical Care performed: No   Procedure(s) performed:   Procedures   ____________________________________________   INITIAL IMPRESSION / ASSESSMENT AND PLAN / ED COURSE  As part of my medical decision making,  I reviewed the following data within the [place] notes reviewed and incorporated, Labs reviewed , Old chart reviewed and Notes from prior ED visits    Differential diagnosis includes, but is not limited to, renal colic, obstructive stone, free air in the abdomen/under the diaphragm, pneumonia, UTI/pyelonephritis.  Most likely since the patient just had lithotripsy she is having renal colic due to passing some small stones.  She has no evidence of infection/sepsis.  Her pain is well controlled after a single dose of morphine.  Her urine shows hematuria and it is difficult to appreciate whether or not she has an infection, will consider antibiotics.  She does have some ketones in her urine and given the vomiting I will give her a liter of fluids.  She is much more comfortable than before and does not need additional pain management.  I spoke by phone with Dr. Bernardo Heater to let him know about the patient given that he relatively recently performed a procedure on her.  He reviewed the radiographs and agreed that if her pain is well controlled and she has no acute lab abnormalities or concern on the urinalysis, she should be able to  Follow-up as an outpatient.  He did encourage me to prescribe Flomax if she is not already taking it.  Clinical Course as of Jun 24 516  Fri Jun 24, 2017  0503 Patient continues to feel well, no more pain, sleeping comfortably.  BMP reassuring.  Will treat  empirics given the possibility of UTI, and will give a dose of Toradol prior to discharge.    I gave my usual and customary return precautions.   [CF]    Clinical Course User Index [CF] Hinda Kehr, MD    ____________________________________________  FINAL CLINICAL IMPRESSION(S) / ED DIAGNOSES  Final diagnoses:  Renal colic on left side  Hematuria, unspecified type     MEDICATIONS GIVEN DURING THIS VISIT:  Medications  ondansetron (ZOFRAN) 4 MG/2ML injection (4 mg  Given 06/24/17 0249)  morphine 2 MG/ML injection (4 mg Intravascular Given 06/24/17 0250)  sodium chloride 0.9 % bolus 1,000 mL (1,000 mLs Intravenous New Bag/Given 06/24/17 0439)  ketorolac (TORADOL) 30 MG/ML injection 15 mg (15 mg Intravenous Given 06/24/17 0516)  cephALEXin (KEFLEX) capsule 500 mg (500 mg Oral Given 06/24/17 0517)     ED Discharge Orders        Ordered    cephALEXin (KEFLEX) 500 MG capsule  2 times daily     06/24/17 0505    oxyCODONE-acetaminophen (PERCOCET) 5-325 MG tablet  Every 6 hours PRN     06/24/17 0507    ondansetron (ZOFRAN ODT) 4 MG disintegrating tablet     06/24/17 0507    tamsulosin (FLOMAX) 0.4 MG CAPS capsule     06/24/17 0507    docusate sodium (COLACE) 100 MG capsule     06/24/17 0507       Note:  This document was prepared using Dragon voice recognition software and may include unintentional dictation errors.    Hinda Kehr, MD 06/24/17 [phone]

## 2017-06-24 NOTE — ED Triage Notes (Signed)
Pt to triage via w/c, appears uncomfortable; St awoke at 11pm having left flank pain; lithotripsy wk ago

## 2017-06-24 NOTE — ED Notes (Signed)
Pt reports left sided flank pain that started around 11pm tonight. Pt reports a 10/10 pain level and is moving around a lot and grimacing. Pt had lithotripsy procedure done last Thursday. Husband at bedside.

## 2017-06-25 LAB — URINE CULTURE: Special Requests: NORMAL

## 2017-06-30 ENCOUNTER — Ambulatory Visit
Admission: RE | Admit: 2017-06-30 | Discharge: 2017-06-30 | Disposition: A | Discharge status: Home / Self Care | Payer: BC Managed Care – PPO | Source: Ambulatory Visit | Attending: Internal Medicine | Admitting: Internal Medicine

## 2017-06-30 DIAGNOSIS — Z1231 Encounter for screening mammogram for malignant neoplasm of breast: Secondary | ICD-10-CM | POA: Diagnosis not present

## 2017-06-30 DIAGNOSIS — Z1239 Encounter for other screening for malignant neoplasm of breast: Secondary | ICD-10-CM

## 2017-07-12 ENCOUNTER — Ambulatory Visit
Admission: RE | Admit: 2017-07-12 | Discharge: 2017-07-12 | Disposition: A | Discharge status: Home / Self Care | Payer: BC Managed Care – PPO | Source: Ambulatory Visit | Attending: Urology | Admitting: Urology

## 2017-07-12 ENCOUNTER — Ambulatory Visit (INDEPENDENT_AMBULATORY_CARE_PROVIDER_SITE_OTHER): Payer: BC Managed Care – PPO | Admitting: Urology

## 2017-07-12 ENCOUNTER — Other Ambulatory Visit: Payer: Self-pay | Admitting: Urology

## 2017-07-12 ENCOUNTER — Encounter: Payer: Self-pay | Admitting: Urology

## 2017-07-12 VITALS — BP 122/67 | HR 64 | Ht 60.0 in | Wt 98.0 lb

## 2017-07-12 DIAGNOSIS — N200 Calculus of kidney: Secondary | ICD-10-CM

## 2017-07-12 MED ORDER — TAMSULOSIN HCL 0.4 MG PO CAPS: ORAL_CAPSULE | ORAL | 0 refills | Status: DC

## 2017-07-12 NOTE — Progress Notes (Signed)
07/12/2017 3:41 PM   Megan Mcintosh [birth year] [id number]  Referring provider: [name], MD [place], [place] [address]  Chief Complaint  Patient presents with  . Post-op Follow-up    2wk post KUB    HPI: 63 year old female with a history of nephrolithiasis who was seen on 06/16/2017 with Dr. Bernardo Heater for increasing volume of a conglomerate of left lower pole stones up to 5 mm.    She initially tolerated the procedure well within several days after the procedure, resumed yoga.  This included maneuvers where she was inverted at times.  After this, she developed an episode of acute severe poorly controlled pain ultimately leading to an ER visit on 06/24/2017.  Her pain resolved after 1 dose of morphine.  She was discharged home.  Urine culture was negative from the state.  Since then, she had no further episodes of flank pain.  No gross hematuria.  No urinary symptoms.  She did initially strain her urine with some very little fragments.  She is previously undergone right ureteroscopy, laser lithotripsy in 09/2015 for an obstructing ureteral calculus.  She is extremely physically active and fit and has been avoiding working out since her ER visit.  She is very anxious to return to her practice of yoga for fears that this could trigger a pain episode.   PMH: Past Medical History:  Diagnosis Date  . Arthritis   . Breast mass, right    benign  . Cervical dysplasia 1980   s/p freezing  . Fatigue   . History of kidney stones 2017, 2019  . Hypercholesteremia   . Nephrolithiasis 1987  . Osteopenia of femoral neck    left  . Pilonidal cyst   . Pinguecula of right eye    removed by Amado Nash  . Vitamin D deficiency 2018  . Wrist fracture 1967    Surgical History: Past Surgical History:  Procedure Laterality Date  . BREAST BIOPSY Right 2012   benign   . BREAST SURGERY  JAN 2012   right breast , BENIGN Rochel Brome  . CESAREAN SECTION     x 2    . COLONOSCOPY N/A 03/26/2016   Procedure: COLONOSCOPY;  Surgeon: Manya Silvas, MD;  Location: [facility] ENDOSCOPY;  Service: Endoscopy;  Laterality: N/A;  . CYSTOSCOPY WITH STENT PLACEMENT Right 09/12/2015   Procedure: CYSTOSCOPY WITH STENT PLACEMENT;  Surgeon: Ardis Hughs, MD;  Location: [facility] ORS;  Service: Urology;  Laterality: Right;  . CYSTOSCOPY/URETEROSCOPY/HOLMIUM LASER/STENT PLACEMENT Right 09/29/2015   Procedure: CYSTOSCOPY/URETEROSCOPY/HOLMIUM LASER/STENT PLACEMENT/ STENT EXCHANGE;  Surgeon: [name], MD;  Location: [facility] ORS;  Service: Urology;  Laterality: Right;  . EXTRACORPOREAL SHOCK WAVE LITHOTRIPSY Left 06/16/2017   Procedure: EXTRACORPOREAL SHOCK WAVE LITHOTRIPSY (ESWL);  Surgeon: [name], MD;  Location: [facility] ORS;  Service: Urology;  Laterality: Left;  . FOOT SURGERY  2006   [facility]:  silicone implants in both great toes   . FOOT SURGERY Bilateral 1884   silicone implants both great toes  . pilonidal  1979   cyst excised   . STONE EXTRACTION WITH BASKET Right 09/29/2015   Procedure: STONE EXTRACTION WITH BASKET;  Surgeon: [name], MD;  Location: [facility] ORS;  Service: Urology;  Laterality: Right;  . TUBAL LIGATION  1990    Home Medications:  Allergies as of 07/12/2017   No Known Allergies     Medication List        Accurate as of 07/12/17  3:41 PM. Always use your most recent med list.          calcium-vitamin D 500-200 MG-UNIT tablet Commonly known as:  OSCAL WITH D Take 1 tablet by mouth daily with breakfast.   docusate sodium 100 MG capsule Commonly known as:  COLACE Take 1 tablet once or twice daily as needed for constipation while taking narcotic pain medicine       Allergies: No Known Allergies  Family History: Family History  Problem Relation Age of Onset  . Aneurysm Mother 5       Hemorrhage CVA  . Stroke Mother   . Urolithiasis Mother   . Kidney disease Mother   . Birth defects Maternal Uncle   . Cancer Neg Hx   .  Prostate cancer Neg Hx   . Kidney cancer Neg Hx   . Breast cancer Neg Hx     Social History:  reports that  has never smoked. she has never used smokeless tobacco. She reports that she drinks alcohol. She reports that she does not use drugs.  ROS: UROLOGY Frequent Urination?: No Hard to postpone urination?: No Burning/pain with urination?: No Get up at night to urinate?: No Leakage of urine?: No Urine stream starts and stops?: No Trouble starting stream?: No Do you have to strain to urinate?: No Blood in urine?: No Urinary tract infection?: No Sexually transmitted disease?: No Injury to kidneys or bladder?: No Painful intercourse?: No Weak stream?: No Currently pregnant?: No Vaginal bleeding?: No Last menstrual period?: n  Gastrointestinal Nausea?: No Vomiting?: No Indigestion/heartburn?: No Diarrhea?: No  Constitutional Fever: No Night sweats?: No Weight loss?: No Fatigue?: No  Skin Skin rash/lesions?: No Itching?: No  Eyes Blurred vision?: No Double vision?: No  Ears/Nose/Throat Sore throat?: No Sinus problems?: No  Hematologic/Lymphatic Swollen glands?: No Easy bruising?: No  Cardiovascular Leg swelling?: No Chest pain?: No  Respiratory Cough?: No Shortness of breath?: No  Endocrine Excessive thirst?: No  Musculoskeletal Back pain?: No Joint pain?: No  Neurological Headaches?: No Dizziness?: No  Psychologic Depression?: No Anxiety?: No  Physical Exam: BP 122/67   Pulse 64   Ht 5' (1.524 m)   Wt 98 lb (44.5 kg)   LMP 05/17/2011   BMI 19.14 kg/m   Constitutional:  Alert and oriented, No acute distress.  Thin, athletic. HEENT: [place] AT, moist mucus membranes.  Trachea midline, no masses. Cardiovascular: No clubbing, cyanosis, or edema. Respiratory: Normal respiratory effort, no increased work of breathing. GI: Abdomen is soft, nontender, nondistended, no abdominal masses GU: No CVA tenderness.  Skin: No rashes, bruises or  suspicious lesions. Neurologic: Grossly intact, no focal deficits, moving all 4 extremities. Psychiatric: Normal mood and affect.  Laboratory Data: Lab Results  Component Value Date   WBC 11.3 (H) 06/24/2017   HGB 12.9 06/24/2017   HCT 38.1 06/24/2017   MCV 88.3 06/24/2017   PLT 384 06/24/2017    Lab Results  Component Value Date   CREATININE 0.86 06/24/2017   Urinalysis Results for orders placed or performed during the hospital encounter of 06/24/17  Urine Culture  Result Value Ref Range   Specimen Description      URINE, RANDOM Performed at [facility], [address] [place], [place] [address]    Special Requests      Normal Performed at [facility], [address] [place], [place] [address]    Culture MULTIPLE SPECIES PRESENT, SUGGEST RECOLLECTION (A)    Report Status 06/25/2017 FINAL   Urinalysis,  Routine w reflex microscopic  Result Value Ref Range   Color, Urine YELLOW (A) YELLOW   APPearance CLOUDY (A) CLEAR   Specific Gravity, Urine 1.030 1.005 - 1.030   pH 5.0 5.0 - 8.0   Glucose, UA NEGATIVE NEGATIVE mg/dL   Hgb urine dipstick LARGE (A) NEGATIVE   Bilirubin Urine NEGATIVE NEGATIVE   Ketones, ur 20 (A) NEGATIVE mg/dL   Protein, ur 30 (A) NEGATIVE mg/dL   Nitrite NEGATIVE NEGATIVE   Leukocytes, UA MODERATE (A) NEGATIVE   RBC / HPF TOO NUMEROUS TO COUNT 0 - 5 RBC/hpf   WBC, UA 6-30 0 - 5 WBC/hpf   Bacteria, UA RARE (A) NONE SEEN   Squamous Epithelial / LPF 0-5 (A) NONE SEEN   Mucus PRESENT   CBC with Differential/Platelet  Result Value Ref Range   WBC 11.3 (H) 3.6 - 11.0 K/uL   RBC 4.32 3.80 - 5.20 MIL/uL   Hemoglobin 12.9 12.0 - 16.0 g/dL   HCT 38.1 35.0 - 47.0 %   MCV 88.3 80.0 - 100.0 fL   MCH 29.8 26.0 - 34.0 pg   MCHC 33.7 32.0 - 36.0 g/dL   RDW 13.0 11.5 - 14.5 %   Platelets 384 150 - 440 K/uL   Neutrophils Relative % 92 %   Neutro Abs 10.4 (H) 1.4 - 6.5 K/uL   Lymphocytes Relative 4 %   Lymphs Abs 0.5 (L) 1.0 - 3.6  K/uL   Monocytes Relative 4 %   Monocytes Absolute 0.5 0.2 - 0.9 K/uL   Eosinophils Relative 0 %   Eosinophils Absolute 0.0 0 - 0.7 K/uL   Basophils Relative 0 %   Basophils Absolute 0.0 0 - 0.1 K/uL  Basic metabolic panel  Result Value Ref Range   Sodium 141 135 - 145 mmol/L   Potassium 3.7 3.5 - 5.1 mmol/L   Chloride 105 101 - 111 mmol/L   CO2 27 22 - 32 mmol/L   Glucose, Bld 116 (H) 65 - 99 mg/dL   BUN 29 (H) 6 - 20 mg/dL   Creatinine, Ser 0.86 0.44 - 1.00 mg/dL   Calcium 9.2 8.9 - 10.3 mg/dL   GFR calc non Af Amer >60 >60 mL/min   GFR calc Af Amer >60 >60 mL/min   Anion gap 9 5 - 15    Pertinent Imaging: Preop and postop KUB from today were personally reviewed and compared to appears that there has been some interval fragmentation of her left lower pole stone burden with perhaps a slight decrease in overall stone burden but there is persistent stone fragment material.  Assessment & Plan:    1. Nephrolithiasis Interval fragmentation of left lower pole stone burden with persistent debris Suspect episode in the emergency room with him passing a small fragment We devised a pain strategy plan today which includes continuation of Flomax, increasing fluids, intranasal Toradol for rescue, and resuming yoga which was actually encouraged today to help her pass nonobstructing fragments  Plan for KUB again in 2 months  We will discuss 32-TFTD urine metabolic workup next visit   2. Microscopic hematuria Plan to recheck urine at next visit  Return in about 2 months (around 09/09/2017) for KUB and UA.   [name], MD  [facility] [address] [place], [place] [address] [phone]

## 2017-08-01 ENCOUNTER — Ambulatory Visit: Payer: Self-pay | Admitting: *Deleted

## 2017-08-01 NOTE — Telephone Encounter (Signed)
Pt reports cough, sore throat, runny nose, "pressure headache;"  Onset yesterday. States cough is mild,  occasional,"greenish"; no headache at present. States cough better this AM, "Not as much, mild." Denies fever, aches, SOB.. Reports her 2 grandchildren she cares for have had similar symptoms. Has tried Tylenol Cold, Mucinex, "Helps for awhile." Home Care advise given per protocol and pt requested appt.  Appt made with Jeral Pinch for tomorrow.  Instructed to call back if symptoms worsen.   Reason for Disposition . Cough with cold symptoms (e.g., runny nose, postnasal drip, throat clearing)  Answer Assessment - Initial Assessment Questions 1. ONSET: "When did the cough begin?"      07/31/17 2. SEVERITY: "How bad is the cough today?"      Moderate this am, now occasional. 3. RESPIRATORY DISTRESS: "Describe your breathing."      WNL 4. FEVER: "Do you have a fever?" If so, ask: "What is your temperature, how was it measured, and when did it start?"     No 5. SPUTUM: "Describe the color of your sputum" (clear, white, yellow, green)     Greenish 6. HEMOPTYSIS: "Are you coughing up any blood?" If so ask: "How much?" (flecks, streaks, tablespoons, etc.)     No 7. CARDIAC HISTORY: "Do you have any history of heart disease?" (e.g., heart attack, congestive heart failure)     no 8. LUNG HISTORY: "Do you have any history of lung disease?"  (e.g., pulmonary embolus, asthma, emphysema)     no 9. PE RISK FACTORS: "Do you have a history of blood clots?" (or: recent major surgery, recent prolonged travel, bedridden )     no 10. OTHER SYMPTOMS: "Do you have any other symptoms?" (e.g., runny nose, wheezing, chest pain)      Nasal discharge is greenish,small amounts, headache "pressure", sore throat 11. PREGNANCY: "Is there any chance you are pregnant?" "When was your last menstrual period?"       no 12. TRAVEL: "Have you traveled out of the country in the last month?" (e.g., travel history,  exposures)       no  Protocols used: [place]

## 2017-08-02 ENCOUNTER — Ambulatory Visit: Payer: BC Managed Care – PPO | Admitting: Family Medicine

## 2017-08-02 ENCOUNTER — Encounter: Payer: Self-pay | Admitting: Family Medicine

## 2017-08-02 VITALS — BP 110/60 | HR 72 | Temp 97.4°F | Wt 99.2 lb

## 2017-08-02 DIAGNOSIS — J209 Acute bronchitis, unspecified: Secondary | ICD-10-CM | POA: Diagnosis not present

## 2017-08-02 MED ORDER — PREDNISONE 10 MG PO TABS: ORAL_TABLET | ORAL | 0 refills | Status: DC

## 2017-08-02 MED ORDER — AZITHROMYCIN 250 MG PO TABS: ORAL_TABLET | ORAL | 0 refills | Status: DC

## 2017-08-02 NOTE — Patient Instructions (Signed)
It was a pleasure meeting you today!  I have sent in a prescription for prednisone. This is a steroid which will help with inflammation.   Mucinex DM can be used for cough.  If no improvement, azithromycin has been provided for you.  Follow up if no improvement or worsening symptoms.   Acute Bronchitis, Adult Acute bronchitis is when air tubes (bronchi) in the lungs suddenly get swollen. The condition can make it hard to breathe. It can also cause these symptoms:  A cough.  Coughing up clear, yellow, or green mucus.  Wheezing.  Chest congestion.  Shortness of breath.  A fever.  Body aches.  Chills.  A sore throat.  Follow these instructions at home: Medicines  Take over-the-counter and prescription medicines only as told by your doctor.  If you were prescribed an antibiotic medicine, take it as told by your doctor. Do not stop taking the antibiotic even if you start to feel better. General instructions  Rest.  Drink enough fluids to keep your pee (urine) clear or pale yellow.  Avoid smoking and secondhand smoke. If you smoke and you need help quitting, ask your doctor. Quitting will help your lungs heal faster.  Use an inhaler, cool mist vaporizer, or humidifier as told by your doctor.  Keep all follow-up visits as told by your doctor. This is important. How is this prevented? To lower your risk of getting this condition again:  Wash your hands often with soap and water. If you cannot use soap and water, use hand sanitizer.  Avoid contact with people who have cold symptoms.  Try not to touch your hands to your mouth, nose, or eyes.  Make sure to get the flu shot every year.  Contact a doctor if:  Your symptoms do not get better in 2 weeks. Get help right away if:  You cough up blood.  You have chest pain.  You have very bad shortness of breath.  You become dehydrated.  You faint (pass out) or keep feeling like you are going to pass  out.  You keep throwing up (vomiting).  You have a very bad headache.  Your fever or chills gets worse. This information is not intended to replace advice given to you by your health care provider. Make sure you discuss any questions you have with your health care provider. Document Released: 10/27/2007 Document Revised: 12/17/2015 Document Reviewed: 10/29/2015 Elsevier Interactive Patient Education  [employer].

## 2017-08-02 NOTE — Progress Notes (Signed)
Patient ID: Megan Mcintosh, female   DOB: 1955-11-11, 62 y.o.   MRN: [id number]  PCP: [name], MD  Subjective:  Megan Mcintosh is a 62 y.o. year old very pleasant female patient who presents with symptoms including nasal congestion, sore throat, cough that is productive of green sputum. Associated chills and history of fever one day ago of Tmax 100 F. No fever today.  -started: 2 to 3  days ago, symptoms are worsening and she reports feeling very "ill" -previous treatments: mucinex DM and flonase have provided limited benefit -sick contacts/travel/risks: denies flu exposure. Recent sick contact exposure with sick grandchildren age 60 and 71 -Hx: Nephrolithiasis No influenza vaccine this year. Not a smoker  ROS-denies fever, SOB, NVD, tooth pain  Pertinent Past Medical History- Impaired fasting glucose  Medications- reviewed  Current Outpatient Medications  Medication Sig Dispense Refill  . Calcium Carb-Cholecalciferol (CALCIUM-VITAMIN D) 500-200 MG-UNIT tablet Take 3 tablets by mouth daily.    . tamsulosin (FLOMAX) 0.4 MG CAPS capsule Take 1 tablet by mouth daily until you pass the kidney stone or no longer have symptoms 30 capsule 0   No current facility-administered medications for this visit.     Objective: BP 110/60 (BP Location: Right Arm, Patient Position: Sitting, Cuff Size: Normal)   Pulse 72   Temp (!) 97.4 F (36.3 C) (Oral)   Wt 99 lb 3.2 oz (45 kg)   LMP 05/17/2011   BMI 19.37 kg/m  Gen: NAD, resting comfortably HEENT: Turbinates erythematous, TMs normal, pharynx mildly erythematous with no tonsilar exudate or edema, + sinus tenderness CV: RRR no murmurs rubs or gallops Lungs: Mild scattered wheezing noted, no rhonchi,  Abdomen: soft/nontender/nondistended/normal bowel sounds. No rebound or guarding.  Ext: no edema Skin: warm, dry, no rash Neuro: grossly normal, moves all extremities  Assessment/Plan: 1. Acute bronchitis, unspecified organism Patient's  symptoms with mild scattered wheezing noted is most likely due to bronchitis. We reviewed that this is most likely viral and not related to a bacterial cause. VSS and lung exam does not make pneumonia likely. No distress today; patient does appear ill as she states. We discussed possible treatment options; provided prednisone taper and if symptoms do not improve with symptomatic treatment in 2 to 3 days, she will take azithromycin that was provided to her. She is not interested in a cough suppressant today; will use Mucinex DM as needed. Advised that if her symptoms were to worsen she should let us know. Return precautions provided  - predniSONE (DELTASONE) 10 MG tablet; Take 4 tablets once daily for 2 days, 3 tabs daily for 2 days, 2 tabs daily for 2 days, 1 tab daily for 2 days.  Dispense: 20 tablet; Refill: 0 - azithromycin (ZITHROMAX) 250 MG tablet; Take 2 tablets by mouth at once today, then one tablet daily for 4 days.  Dispense: 6 tablet; Refill: 0   Finally, we reviewed reasons to return to care including if symptoms worsen or persist or new concerns arise- once again particularly shortness of breath or fever.    Laurita Quint, FNP

## 2017-09-20 ENCOUNTER — Ambulatory Visit
Admission: RE | Admit: 2017-09-20 | Discharge: 2017-09-20 | Disposition: A | Discharge status: Home / Self Care | Payer: BC Managed Care – PPO | Source: Ambulatory Visit | Attending: Urology | Admitting: Urology

## 2017-09-20 ENCOUNTER — Encounter: Payer: Self-pay | Admitting: Urology

## 2017-09-20 ENCOUNTER — Ambulatory Visit (INDEPENDENT_AMBULATORY_CARE_PROVIDER_SITE_OTHER): Payer: BC Managed Care – PPO | Admitting: Urology

## 2017-09-20 VITALS — BP 132/73 | HR 71 | Ht 60.0 in | Wt 98.0 lb

## 2017-09-20 DIAGNOSIS — N200 Calculus of kidney: Secondary | ICD-10-CM | POA: Diagnosis present

## 2017-09-20 DIAGNOSIS — N3001 Acute cystitis with hematuria: Secondary | ICD-10-CM | POA: Diagnosis not present

## 2017-09-20 LAB — MICROSCOPIC EXAMINATION

## 2017-09-20 LAB — URINALYSIS, COMPLETE
Bilirubin, UA: NEGATIVE
GLUCOSE, UA: NEGATIVE
NITRITE UA: NEGATIVE
Protein, UA: NEGATIVE
SPEC GRAV UA: 1.02 (ref 1.005–1.030)
Urobilinogen, Ur: 0.2 mg/dL (ref 0.2–1.0)
pH, UA: 5.5 (ref 5.0–7.5)

## 2017-09-20 NOTE — Progress Notes (Signed)
09/20/2017 3:47 PM   Megan Mcintosh 1955-07-18 [id number]  Referring provider: [name], MD [place], [place] [address]  Chief Complaint  Patient presents with  . Nephrolithiasis    27month w/KUB    HPI: 62 year old female with a history of nephrolithiasis returns today for routine follow-up.  Most recently, she underwent shockwave lithotripsy in the left lower pole for increasing conglomeration of left lower pole stones.  She did have fragmentation of the stones but continues to have residual pieces.  She did have an emergency room visit following the procedure for an acute episode of flank pain following yoga, presumably from passing a stone fragment.  Since her last visit, she had no further episodes of pain.  She does have intranasal Toradol for use at home as needed but has not required this medication.  She is back to doing yoga but avoids inverted positions.  She is previously undergone right ureteroscopy, laser lithotripsy in 09/2015 for an obstructing ureteral calculus. She denies any flank pain or urinary symptoms today.  She is overall doing quite well.  KUB today shows minimal change from her last KUB 2 months ago small nonobstructing stone fragments in the left lower pole as well as a single nonobstructing right midpole stone.,    PMH: Past Medical History:  Diagnosis Date  . Arthritis   . Breast mass, right    benign  . Cervical dysplasia 1980   s/p freezing  . Fatigue   . History of kidney stones 2017, 2019  . Hypercholesteremia   . Nephrolithiasis 1987  . Osteopenia of femoral neck    left  . Pilonidal cyst   . Pinguecula of right eye    removed by Amado Nash  . Vitamin D deficiency 2018  . Wrist fracture 1967    Surgical History: Past Surgical History:  Procedure Laterality Date  . BREAST BIOPSY Right 2012   benign   . BREAST SURGERY  JAN 2012   right breast , BENIGN Rochel Brome  . CESAREAN SECTION     x 2  .  COLONOSCOPY N/A 03/26/2016   Procedure: COLONOSCOPY;  Surgeon: Manya Silvas, MD;  Location: [facility] ENDOSCOPY;  Service: Endoscopy;  Laterality: N/A;  . CYSTOSCOPY WITH STENT PLACEMENT Right 09/12/2015   Procedure: CYSTOSCOPY WITH STENT PLACEMENT;  Surgeon: Ardis Hughs, MD;  Location: [facility] ORS;  Service: Urology;  Laterality: Right;  . CYSTOSCOPY/URETEROSCOPY/HOLMIUM LASER/STENT PLACEMENT Right 09/29/2015   Procedure: CYSTOSCOPY/URETEROSCOPY/HOLMIUM LASER/STENT PLACEMENT/ STENT EXCHANGE;  Surgeon: [name], MD;  Location: [facility] ORS;  Service: Urology;  Laterality: Right;  . EXTRACORPOREAL SHOCK WAVE LITHOTRIPSY Left 06/16/2017   Procedure: EXTRACORPOREAL SHOCK WAVE LITHOTRIPSY (ESWL);  Surgeon: [name], MD;  Location: [facility] ORS;  Service: Urology;  Laterality: Left;  . FOOT SURGERY  2006   [facility]:  silicone implants in both great toes   . FOOT SURGERY Bilateral 0160   silicone implants both great toes  . pilonidal  1979   cyst excised   . STONE EXTRACTION WITH BASKET Right 09/29/2015   Procedure: STONE EXTRACTION WITH BASKET;  Surgeon: [name], MD;  Location: [facility] ORS;  Service: Urology;  Laterality: Right;  . TUBAL LIGATION  1990    Home Medications:  Allergies as of 09/20/2017   No Known Allergies     Medication List        Accurate as of 09/20/17  3:47 PM. Always use your most recent med list.  aspirin EC 81 MG tablet Take by mouth.   calcium-vitamin D 500-200 MG-UNIT tablet Take 3 tablets by mouth daily.       Allergies: No Known Allergies  Family History: Family History  Problem Relation Age of Onset  . Aneurysm Mother 44       Hemorrhage CVA  . Stroke Mother   . Urolithiasis Mother   . Kidney disease Mother   . Birth defects Maternal Uncle   . Cancer Neg Hx   . Prostate cancer Neg Hx   . Kidney cancer Neg Hx   . Breast cancer Neg Hx     Social History:  reports that she has never smoked. She has never used smokeless  tobacco. She reports that she drinks alcohol. She reports that she does not use drugs.  ROS: UROLOGY Frequent Urination?: No Hard to postpone urination?: No Burning/pain with urination?: No Get up at night to urinate?: No Leakage of urine?: No Urine stream starts and stops?: No Trouble starting stream?: No Do you have to strain to urinate?: No Blood in urine?: No Urinary tract infection?: No Sexually transmitted disease?: No Injury to kidneys or bladder?: No Painful intercourse?: No Weak stream?: No Currently pregnant?: No Vaginal bleeding?: No Last menstrual period?: n  Gastrointestinal Nausea?: No Vomiting?: No Indigestion/heartburn?: No Diarrhea?: No Constipation?: No  Constitutional Fever: No Night sweats?: No Weight loss?: No Fatigue?: No  Skin Skin rash/lesions?: No Itching?: No  Eyes Blurred vision?: No Double vision?: No  Ears/Nose/Throat Sore throat?: No Sinus problems?: No  Hematologic/Lymphatic Swollen glands?: No Easy bruising?: No  Cardiovascular Leg swelling?: No Chest pain?: No  Respiratory Cough?: No Shortness of breath?: No  Endocrine Excessive thirst?: No  Musculoskeletal Back pain?: No Joint pain?: No  Neurological Headaches?: No Dizziness?: No  Psychologic Depression?: No Anxiety?: No  Physical Exam: BP 132/73   Pulse 71   Ht 5' (1.524 m)   Wt 98 lb (44.5 kg)   LMP 05/17/2011   BMI 19.14 kg/m   Constitutional:  Alert and oriented, No acute distress.  Thin, athletic. HEENT: [place] AT, moist mucus membranes.  Trachea midline, no masses. Cardiovascular: No clubbing, cyanosis, or edema. Respiratory: Normal respiratory effort, no increased work of breathing. Skin: No rashes, bruises or suspicious lesions. Neurologic: Grossly intact, no focal deficits, moving all 4 extremities. Psychiatric: Normal mood and affect.  Laboratory Data: UA today reviewed, evidence of red and white bacteria and calcium oxalate crystals.   Urine culture sent.  Pertinent Imaging: Multiple KUB is reviewed today with the patient in for comparison to previous.  This includes KUB from today, 2 months ago, and in January/2019.  Assessment & Plan:    1. Nephrolithiasis Interval fragmentation of left lower pole stone burden with persistent debris  KUB today is essentially unchanged from 2 months ago We discussed options including ureteroscopy to treat her stone fragments, declined further treatment and prefers observation Reviewed stone diet recommendations, declined 24-hour urine at this time Plan for KUB in 1 year or sooner as needed  2. Microscopic hematuria UA today concerning for infection Will send urine culture, consider treatment although she is currently asymptomatic She reports today that she did not provide imaging collection or use the wipe We will have her repeat her urinalysis next week to ensure that this resolves as this may have been a contaminant  Return in about 1 year (around 09/21/2018) for KUB and lab visit urine drop of next week.   [name], MD  [facility] [address], [place], [place] [address] [phone]

## 2017-09-23 ENCOUNTER — Telehealth: Payer: Self-pay

## 2017-09-23 LAB — CULTURE, URINE COMPREHENSIVE

## 2017-09-23 NOTE — Telephone Encounter (Signed)
lmom for pt call back.

## 2017-09-23 NOTE — Telephone Encounter (Signed)
-----   Message from [name], MD sent at 09/23/2017 11:48 AM EDT ----- Please let Megan Mcintosh know that we sent a urine culture based on the appearance of her urine under the microscope which was somewhat suspicious for infection.  She ultimately did end up growing a bacteria associated with urinary tract infections, albeit a very low colony count.  Given that she was asymptomatic, I do not feel that she needs antibiotics at this time but please let her know if she does start developing UTI symptoms, I would like to treat this.  [name], MD

## 2017-09-23 NOTE — Telephone Encounter (Signed)
Pt informed

## 2017-09-27 ENCOUNTER — Other Ambulatory Visit: Payer: BC Managed Care – PPO

## 2017-09-27 DIAGNOSIS — N200 Calculus of kidney: Secondary | ICD-10-CM

## 2017-09-27 LAB — URINALYSIS, COMPLETE
Bilirubin, UA: NEGATIVE
Glucose, UA: NEGATIVE
Nitrite, UA: NEGATIVE
Protein, UA: NEGATIVE
Specific Gravity, UA: 1.025 (ref 1.005–1.030)
Urobilinogen, Ur: 0.2 mg/dL (ref 0.2–1.0)
pH, UA: 5 (ref 5.0–7.5)

## 2017-09-27 LAB — MICROSCOPIC EXAMINATION

## 2017-09-28 ENCOUNTER — Telehealth: Payer: Self-pay | Admitting: Family Medicine

## 2017-09-28 NOTE — Telephone Encounter (Signed)
-----   Message from [name], MD sent at 09/28/2017 12:12 PM EDT ----- Urinalysis today was exactly the same as last week, consistent with underlying urinary tract infection.  I would recommend waiting a month or 2 in repeating to see if you are able to clear the infection on your own, otherwise take antibiotics and repeat in a few weeks.  I want to ensure that this blood resolves.  [name], MD

## 2017-09-28 NOTE — Telephone Encounter (Signed)
Patient notified

## 2017-11-07 ENCOUNTER — Other Ambulatory Visit: Payer: BC Managed Care – PPO

## 2017-11-07 DIAGNOSIS — N3001 Acute cystitis with hematuria: Secondary | ICD-10-CM

## 2017-11-08 LAB — URINALYSIS, COMPLETE
Bilirubin, UA: NEGATIVE
Glucose, UA: NEGATIVE
Nitrite, UA: NEGATIVE
PROTEIN UA: NEGATIVE
Urobilinogen, Ur: 0.2 mg/dL (ref 0.2–1.0)
pH, UA: 5.5 (ref 5.0–7.5)

## 2017-11-08 LAB — MICROSCOPIC EXAMINATION

## 2017-11-09 ENCOUNTER — Telehealth: Payer: Self-pay

## 2017-11-09 LAB — CULTURE, URINE COMPREHENSIVE

## 2017-11-09 NOTE — Telephone Encounter (Signed)
-----   Message from [name], MD sent at 11/08/2017  7:35 PM EDT ----- No more blood in urine and appears to be clearing up.  We will let you know if culture grows anything this time.  Great news!  [name], MD

## 2017-11-09 NOTE — Telephone Encounter (Signed)
Left detailed message.   

## 2018-02-16 ENCOUNTER — Ambulatory Visit (INDEPENDENT_AMBULATORY_CARE_PROVIDER_SITE_OTHER): Payer: BC Managed Care – PPO | Admitting: Internal Medicine

## 2018-02-16 ENCOUNTER — Encounter: Payer: Self-pay | Admitting: Internal Medicine

## 2018-02-16 VITALS — BP 116/78 | HR 66 | Temp 97.8°F | Resp 14 | Ht 60.0 in | Wt 98.4 lb

## 2018-02-16 DIAGNOSIS — R636 Underweight: Secondary | ICD-10-CM | POA: Diagnosis not present

## 2018-02-16 DIAGNOSIS — E559 Vitamin D deficiency, unspecified: Secondary | ICD-10-CM

## 2018-02-16 DIAGNOSIS — R5383 Other fatigue: Secondary | ICD-10-CM | POA: Diagnosis not present

## 2018-02-16 DIAGNOSIS — R7301 Impaired fasting glucose: Secondary | ICD-10-CM | POA: Diagnosis not present

## 2018-02-16 DIAGNOSIS — M810 Age-related osteoporosis without current pathological fracture: Secondary | ICD-10-CM

## 2018-02-16 DIAGNOSIS — Z0000 Encounter for general adult medical examination without abnormal findings: Secondary | ICD-10-CM | POA: Diagnosis not present

## 2018-02-16 DIAGNOSIS — N200 Calculus of kidney: Secondary | ICD-10-CM

## 2018-02-16 LAB — COMPREHENSIVE METABOLIC PANEL
ALT: 9 U/L (ref 0–35)
AST: 17 U/L (ref 0–37)
Albumin: 4.9 g/dL (ref 3.5–5.2)
Alkaline Phosphatase: 64 U/L (ref 39–117)
BILIRUBIN TOTAL: 0.7 mg/dL (ref 0.2–1.2)
BUN: 22 mg/dL (ref 6–23)
CALCIUM: 9.9 mg/dL (ref 8.4–10.5)
CO2: 27 meq/L (ref 19–32)
CREATININE: 0.8 mg/dL (ref 0.40–1.20)
Chloride: 101 mEq/L (ref 96–112)
GFR: 77.22 mL/min (ref >60.00)
GLUCOSE: 81 mg/dL (ref 70–99)
Potassium: 4 mEq/L (ref 3.5–5.1)
Sodium: 138 mEq/L (ref 135–145)
Total Protein: 7.4 g/dL (ref 6.0–8.3)

## 2018-02-16 LAB — LIPID PANEL
Cholesterol: 232 mg/dL — ABNORMAL HIGH (ref 0–200)
HDL: 111.2 mg/dL (ref >39.00)
LDL Cholesterol: 107 mg/dL — ABNORMAL HIGH (ref 0–99)
NONHDL: 120.67
Total CHOL/HDL Ratio: 2
Triglycerides: 68 mg/dL (ref 0.0–149.0)
VLDL: 13.6 mg/dL (ref 0.0–40.0)

## 2018-02-16 LAB — TSH: TSH: 1.59 u[IU]/mL (ref 0.35–4.50)

## 2018-02-16 LAB — VITAMIN D 25 HYDROXY (VIT D DEFICIENCY, FRACTURES): VITD: 58.27 ng/mL (ref 30.00–100.00)

## 2018-02-16 LAB — HEMOGLOBIN A1C: HEMOGLOBIN A1C: 5.6 % (ref 4.6–6.5)

## 2018-02-16 LAB — VITAMIN B12: Vitamin B-12: 194 pg/mL — ABNORMAL LOW (ref 211–911)

## 2018-02-16 MED ORDER — ZOSTER VAC RECOMB ADJUVANTED 50 MCG/0.5ML IM SUSR: 0.5000 mL | Freq: Once | INTRAMUSCULAR | 1 refills | Status: AC

## 2018-02-16 NOTE — Patient Instructions (Addendum)
Pecola Lawless  Is the endocrinologist at [facility]  That I am sending you to in [place]   Osteoporosis Osteoporosis is the thinning and loss of density in the bones. Osteoporosis makes the bones more brittle, fragile, and likely to break (fracture). Over time, osteoporosis can cause the bones to become so weak that they fracture after a simple fall. The bones most likely to fracture are the bones in the hip, wrist, and spine. What are the causes? The exact cause is not known. What increases the risk? Anyone can develop osteoporosis. You may be at greater risk if you have a family history of the condition or have poor nutrition. You may also have a higher risk if you are:  Female.  62 years old or older.  A smoker.  Not physically active.  White or Asian.  Slender.  What are the signs or symptoms? A fracture might be the first sign of the disease, especially if it results from a fall or injury that would not usually cause a bone to break. Other signs and symptoms include:  Low back and neck pain.  Stooped posture.  Height loss.  How is this diagnosed? To make a diagnosis, your health care provider may:  Take a medical history.  Perform a physical exam.  Order tests, such as: ? A bone mineral density test. ? A dual-energy X-ray absorptiometry test.  How is this treated? The goal of osteoporosis treatment is to strengthen your bones to reduce your risk of a fracture. Treatment may involve:  Making lifestyle changes, such as: ? Eating a diet rich in calcium. ? Doing weight-bearing and muscle-strengthening exercises. ? Stopping tobacco use. ? Limiting alcohol intake.  Taking medicine to slow the process of bone loss or to increase bone density.  Monitoring your levels of calcium and vitamin D.  Follow these instructions at home:  Include calcium and vitamin D in your diet. Calcium is important for bone health, and vitamin D helps the body absorb calcium.  Perform  weight-bearing and muscle-strengthening exercises as directed by your health care provider.  Do not use any tobacco products, including cigarettes, chewing tobacco, and electronic cigarettes. If you need help quitting, ask your health care provider.  Limit your alcohol intake.  Take medicines only as directed by your health care provider.  Keep all follow-up visits as directed by your health care provider. This is important.  Take precautions at home to lower your risk of falling, such as: ? Keeping rooms well lit and clutter free. ? Installing safety rails on stairs. ? Using rubber mats in the bathroom and other areas that are often wet or slippery. Get help right away if: You fall or injure yourself. This information is not intended to replace advice given to you by your health care provider. Make sure you discuss any questions you have with your health care provider. Document Released: 02/17/2005 Document Revised: 10/13/2015 Document Reviewed: 10/18/2013 Elsevier Interactive Patient Education  [employer].

## 2018-02-16 NOTE — Progress Notes (Signed)
Patient ID: Megan Mcintosh, female    DOB: 1956/01/26  Age: 62 y.o. MRN: [id number]  The patient is here for annual PREVENTIVE  examination and management of other chronic and acute problems.   The risk factors are reflected in the social history.  The roster of all physicians providing medical care to patient - is listed in the Snapshot section of the chart.  Activities of daily living:  The patient is 100% independent in all ADLs: dressing, toileting, feeding as well as independent mobility  Home safety : The patient has smoke detectors in the home. They wear seatbelts.  There are no firearms at home. There is no violence in the home.   There is no risks for hepatitis, STDs or HIV. There is no   history of blood transfusion. They have no travel history to infectious disease endemic areas of the world.  The patient has seen their dentist in the last six month. They have seen their eye doctor in the last year. They deny hearing difficulty with regard to whispered voices or television programs.  They have deferred audiologic testing in the last year.  They have a remote history of excessive sun exposure and uses  sun protection: hats, long sleeves and use of sunscreen if there is significant sun exposure.   Diet: the importance of a healthy diet is discussed. They do have a healthy diet.  The benefits of regular aerobic exercise were discussed. She plays tennis and practices yoga for 60 minutes daily 5 days  per week , Depression screen: there are no signs or vegative symptoms of depression- irritability, change in appetite, anhedonia, sadness/tearfullness.  Cognitive assessment: the patient manages all their financial and personal affairs and is actively engaged. They could relate day,date,year and events; recalled 2/3 objects at 3 minutes; performed clock-face test normally.  The following portions of the patient's history were reviewed and updated as appropriate: allergies, current medications,  past family history, past medical history,  past surgical history, past social history  and problem list.  Visual acuity was not assessed per patient preference since she has regular follow up with her ophthalmologist. Hearing and body mass index were assessed and reviewed.   During the course of the visit the patient was educated and counseled about appropriate screening and preventive services including : fall prevention , diabetes screening, nutrition counseling, colorectal cancer screening, and recommended immunizations.    CC: The primary encounter diagnosis was Impaired fasting glucose. Diagnoses of Nephrolithiasis, Vitamin D deficiency, Patient underweight, Fatigue, unspecified type, Osteoporosis of femur without pathological fracture, and Encounter for general adult medical examination without abnormal findings were also pertinent to this visit.  NO COMPLAINTS.  Underwent lithotripsy Jan 2019 for left sided stones accumulating and conglomerating stones   Has been avoiding inverted position, avoiding the foods and hyperhydrating since last epsiode   History Megan Mcintosh has a past medical history of Arthritis, Breast mass, right, Cervical dysplasia (1980), Fatigue, History of kidney stones (2017, 2019), Hypercholesteremia, Nephrolithiasis (1987), Osteopenia of femoral neck, Pilonidal cyst, Pinguecula of right eye, Vitamin D deficiency (2018), and Wrist fracture (1967).   She has a past surgical history that includes Foot surgery (2006); Tubal ligation (1990); Cesarean section; pilonidal (1979); Cystoscopy with stent placement (Right, 09/12/2015); Cystoscopy/ureteroscopy/holmium laser/stent placement (Right, 09/29/2015); Stone extraction with basket (Right, 09/29/2015); Colonoscopy (N/A, 03/26/2016); Breast surgery (JAN 2012); Foot surgery (Bilateral, 2006); Extracorporeal shock wave lithotripsy (Left, 06/16/2017); and Breast biopsy (Right, 2012).   Her family history includes Aneurysm (age of  onset: 3) in  her mother; Birth defects in her maternal uncle; Kidney disease in her mother; Stroke in her mother; Urolithiasis in her mother.She reports that she has never smoked. She has never used smokeless tobacco. She reports that she drinks alcohol. She reports that she does not use drugs.  Outpatient Medications Prior to Visit  Medication Sig Dispense Refill  . Calcium Carb-Cholecalciferol (CALCIUM-VITAMIN D) 500-200 MG-UNIT tablet Take 3 tablets by mouth daily.    Marland Kitchen aspirin EC 81 MG tablet Take by mouth.     No facility-administered medications prior to visit.     Review of Systems   Patient denies headache, fevers, malaise, unintentional weight loss, skin rash, eye pain, sinus congestion and sinus pain, sore throat, dysphagia,  hemoptysis , cough, dyspnea, wheezing, chest pain, palpitations, orthopnea, edema, abdominal pain, nausea, melena, diarrhea, constipation, flank pain, dysuria, hematuria, urinary  Frequency, nocturia, numbness, tingling, seizures,  Focal weakness, Loss of consciousness,  Tremor, insomnia, depression, anxiety, and suicidal ideation.      Objective:  BP 116/78 (BP Location: Left Arm, Patient Position: Sitting, Cuff Size: Normal)   Pulse 66   Temp 97.8 F (36.6 C) (Oral)   Resp 14   Ht 5' (1.524 m)   Wt 98 lb 6.4 oz (44.6 kg)   LMP 05/17/2011   SpO2 99%   BMI 19.22 kg/m   Physical Exam   General appearance: alert, cooperative and appears stated age Head: Normocephalic, without obvious abnormality, atraumatic Eyes: conjunctivae/corneas clear. PERRL, EOM's intact. Fundi benign. Ears: normal TM's and external ear canals both ears Nose: Nares normal. Septum midline. Mucosa normal. No drainage or sinus tenderness. Throat: lips, mucosa, and tongue normal; teeth and gums normal Neck: no adenopathy, no carotid bruit, no JVD, supple, symmetrical, trachea midline and thyroid not enlarged, symmetric, no tenderness/mass/nodules Lungs: clear to auscultation  bilaterally Breasts: normal appearance, no masses or tenderness Heart: regular rate and rhythm, S1, S2 normal, no murmur, click, rub or gallop Abdomen: soft, non-tender; bowel sounds normal; no masses,  no organomegaly Extremities: extremities normal, atraumatic, no cyanosis or edema Pulses: 2+ and symmetric Skin: Skin color, texture, turgor normal. No rashes or lesions Neurologic: Alert and oriented X 3, normal strength and tone. Normal symmetric reflexes. Normal coordination and gait.      Assessment & Plan:   Problem List Items Addressed This Visit    Encounter for general adult medical examination without abnormal findings    Annual comprehensive preventive exam was done as well as an evaluation and management of chronic conditions .  During the course of the visit the patient was educated and counseled about appropriate screening and preventive services including :  diabetes screening, lipid analysis with projected  10 year  risk for CAD , nutrition counseling, breast, cervical and colorectal cancer screening, and recommended immunizations.  Printed recommendations for health maintenance screenings was given      Impaired fasting glucose - Primary    a1c is at the high end of normal. .  Lab Results  Component Value Date   HGBA1C 5.6 02/16/2018         Relevant Orders   Lipid panel (Completed)   Hemoglobin A1c (Completed)   Nephrolithiasis    S/p lithotripsy jan 2019 for management of progressive conglomeration of stones noted in left renal pelvis.       Relevant Orders   Comprehensive metabolic panel (Completed)   Osteoporosis of femur without pathological fracture    A total of 25 minutes of face  to face time was spent with patient more than half of which was spent in counselling about the various forms of treatment . She is adamantly opposed to [facility] because of the route of administration and is considerating Evista. Calcium and vitamin D supplementation discussed        Vitamin D deficiency   Relevant Orders   VITAMIN D 25 Hydroxy (Vit-D Deficiency, Fractures) (Completed)    Other Visit Diagnoses    Patient underweight       Relevant Orders   TSH (Completed)   Fatigue, unspecified type       Relevant Orders   Vitamin B12 (Completed)      I have discontinued Megan Mcintosh's aspirin EC. I am also having her start on Zoster Vaccine Adjuvanted. Additionally, I am having her maintain her calcium-vitamin D.  Meds ordered this encounter  Medications  . Zoster Vaccine Adjuvanted [facility]) injection    Sig: Inject 0.5 mLs into the muscle once for 1 dose.    Dispense:  1 each    Refill:  1    Medications Discontinued During This Encounter  Medication Reason  . aspirin EC 81 MG tablet Patient has not taken in last 30 days    Follow-up: No follow-ups on file.   [name], MD

## 2018-02-18 NOTE — Assessment & Plan Note (Addendum)
Annual comprehensive preventive exam was done as well as an evaluation and management of chronic conditions .  During the course of the visit the patient was educated and counseled about appropriate screening and preventive services including :  diabetes screening, lipid analysis with projected  10 year  risk for CAD , nutrition counseling, breast, cervical and colorectal cancer screening, and recommended immunizations.  Printed recommendations for health maintenance screenings was given.  Lab Results  Component Value Date   TSH 1.59 02/16/2018   Lab Results  Component Value Date   CHOL 232 (H) 02/16/2018   HDL 111.20 02/16/2018   LDLCALC 107 (H) 02/16/2018   LDLDIRECT 107.0 02/15/2017   TRIG 68.0 02/16/2018   CHOLHDL 2 02/16/2018

## 2018-02-18 NOTE — Assessment & Plan Note (Signed)
A total of 25 minutes of face to face time was spent with patient more than half of which was spent in counselling about the various forms of treatment . She is adamantly opposed to [facility] because of the route of administration and is considerating Evista. Calcium and vitamin D supplementation discussed

## 2018-02-18 NOTE — Assessment & Plan Note (Signed)
a1c is at the high end of normal. .  Lab Results  Component Value Date   HGBA1C 5.6 02/16/2018

## 2018-02-18 NOTE — Assessment & Plan Note (Signed)
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

## 2018-02-19 ENCOUNTER — Other Ambulatory Visit: Payer: Self-pay | Admitting: Internal Medicine

## 2018-02-19 DIAGNOSIS — E538 Deficiency of other specified B group vitamins: Secondary | ICD-10-CM | POA: Insufficient documentation

## 2018-03-01 ENCOUNTER — Ambulatory Visit (INDEPENDENT_AMBULATORY_CARE_PROVIDER_SITE_OTHER): Payer: BC Managed Care – PPO | Admitting: *Deleted

## 2018-03-01 DIAGNOSIS — E538 Deficiency of other specified B group vitamins: Secondary | ICD-10-CM | POA: Diagnosis not present

## 2018-03-01 MED ORDER — CYANOCOBALAMIN 1000 MCG/ML IJ SOLN
1000.0000 ug | Freq: Once | INTRAMUSCULAR | Status: AC
  Administered 2018-03-01: 1000 ug via INTRAMUSCULAR

## 2018-03-01 NOTE — Progress Notes (Signed)
Patient presented for B 12 injection to left deltoid, patient voiced no concerns nor showed any signs of distress during injection. 

## 2018-03-08 ENCOUNTER — Ambulatory Visit (INDEPENDENT_AMBULATORY_CARE_PROVIDER_SITE_OTHER): Payer: BC Managed Care – PPO | Admitting: Lab

## 2018-03-08 DIAGNOSIS — M810 Age-related osteoporosis without current pathological fracture: Secondary | ICD-10-CM

## 2018-03-08 DIAGNOSIS — Z1239 Encounter for other screening for malignant neoplasm of breast: Secondary | ICD-10-CM

## 2018-03-08 DIAGNOSIS — E538 Deficiency of other specified B group vitamins: Secondary | ICD-10-CM | POA: Diagnosis not present

## 2018-03-08 MED ORDER — CYANOCOBALAMIN 1000 MCG/ML IJ SOLN
1000.0000 ug | Freq: Once | INTRAMUSCULAR | Status: AC
  Administered 2018-03-08: 1000 ug via INTRAMUSCULAR

## 2018-03-08 NOTE — Progress Notes (Signed)
Pt was here for vb12 injection, Pt tolerated well.

## 2018-03-15 ENCOUNTER — Ambulatory Visit (INDEPENDENT_AMBULATORY_CARE_PROVIDER_SITE_OTHER): Payer: BC Managed Care – PPO | Admitting: Lab

## 2018-03-15 DIAGNOSIS — E538 Deficiency of other specified B group vitamins: Secondary | ICD-10-CM

## 2018-03-15 MED ORDER — CYANOCOBALAMIN 1000 MCG/ML IJ SOLN
1000.0000 ug | Freq: Once | INTRAMUSCULAR | Status: AC
  Administered 2018-03-15: 1000 ug via INTRAMUSCULAR

## 2018-03-15 NOTE — Progress Notes (Addendum)
Pt received last Vit- B12 Today, Pt wants to know whats the next plan for the provider. Pt tolerated well.  Per my results note to patient,  During one of her nurse visits she was to get additional labs to determine if longterm use of b12 injections was needed.  Until she does that,  She can start taking oral b12 2500 mcg daily

## 2018-03-23 ENCOUNTER — Other Ambulatory Visit (INDEPENDENT_AMBULATORY_CARE_PROVIDER_SITE_OTHER): Payer: BC Managed Care – PPO

## 2018-03-23 DIAGNOSIS — E538 Deficiency of other specified B group vitamins: Secondary | ICD-10-CM | POA: Diagnosis not present

## 2018-03-23 NOTE — Telephone Encounter (Signed)
Pt is scheduled for lab appt and coming in today.

## 2018-03-27 ENCOUNTER — Encounter: Payer: Self-pay | Admitting: Internal Medicine

## 2018-03-27 ENCOUNTER — Ambulatory Visit: Payer: BC Managed Care – PPO | Admitting: Internal Medicine

## 2018-03-27 VITALS — BP 108/70 | HR 60 | Ht 61.0 in | Wt 100.8 lb

## 2018-03-27 DIAGNOSIS — E559 Vitamin D deficiency, unspecified: Secondary | ICD-10-CM | POA: Diagnosis not present

## 2018-03-27 DIAGNOSIS — M810 Age-related osteoporosis without current pathological fracture: Secondary | ICD-10-CM

## 2018-03-27 NOTE — Progress Notes (Signed)
Patient ID: Megan Mcintosh, female   DOB: 1956-02-07, 62 y.o.   MRN: [id number]    HPI  Megan Mcintosh is a 62 y.o.-year-old female, referred by her PCP, Dr. Derrel Nip, for management of osteoporosis.  Pt was dx with OP in 2018, previous osteopenia.  I reviewed pt's DEXA scans: Date L1-L4 T score FN T score 33% distal Radius  05/19/2017 -0.8 (-3.7%*) RFN: -2.7 LFN: -1.9 n/a  02/24/2015 -0.5 RFN: -1.9 LFN: -2.1 n/a   No fractures as an adult.  She had a fracture at 62 years old.   No dizziness/vertigo/orthostasis/poor vision. No falls.  She did not try any other osteoporosis treatments.  + h/o vitamin D deficiency. Reviewed available vit D levels: Lab Results  Component Value Date   VD25OH 58.27 02/16/2018   VD25OH 23.90 (L) 02/15/2017   VD25OH 41.87 12/25/2015   VD25OH 34.89 01/15/2015   VD25OH 44.53 12/26/2013   Pt is on calcium + vitamin D supplements.  She takes 600 mg calcium- D3 times a day.  Meals: Include fruit and veggies.  Takes a collagen powder scoop a day.  She has a history of kidney stones.  No weight bearing exercises.  Does power yoga - started 3 years ago, plays tennis, golf, pickleball, walking.  She has been active all her life, and practices sports almost every day.  She does not take high vitamin A doses.  Menopause was at 12.  FH of osteoporosis: father - compression fxs late in his 70s.  No h/o hyper/hypocalcemia or hyperparathyroidism.  Lab Results  Component Value Date   CALCIUM 9.9 02/16/2018   CALCIUM 9.2 06/24/2017   CALCIUM 10.3 02/15/2017   CALCIUM 10.0 12/25/2015   CALCIUM 10.2 09/12/2015   CALCIUM 10.0 01/15/2015   CALCIUM 9.6 12/26/2013   CALCIUM 9.1 11/27/2012   CALCIUM 9.5 12/15/2011   No h/o thyrotoxicosis. Reviewed TSH recent levels:  Lab Results  Component Value Date   TSH 1.59 02/16/2018   TSH 1.73 02/15/2017   TSH 1.72 12/25/2015   TSH 1.58 01/15/2015   TSH 1.94 12/26/2013   No h/o CKD. Last BUN/Cr: Lab Results    Component Value Date   BUN 22 02/16/2018   CREATININE 0.80 02/16/2018   + b12 shots.  ROS: Constitutional: no weight gain/loss, no fatigue, no subjective hyperthermia/hypothermia Eyes: no blurry vision, no xerophthalmia ENT: no sore throat, no nodules palpated in throat, no dysphagia/odynophagia, no hoarseness Cardiovascular: no CP/SOB/palpitations/leg swelling Respiratory: no cough/SOB Gastrointestinal: no N/V/D/C Musculoskeletal: no muscle/joint aches Skin: no rashes Neurological: no tremors/numbness/tingling/dizziness Psychiatric: no depression/anxiety  Past Medical History:  Diagnosis Date  . Arthritis   . Breast mass, right    benign  . Cervical dysplasia 1980   s/p freezing  . Fatigue   . History of kidney stones 2017, 2019  . Hypercholesteremia   . Nephrolithiasis 1987  . Osteopenia of femoral neck    left  . Pilonidal cyst   . Pinguecula of right eye    removed by Amado Nash  . Vitamin D deficiency 2018  . Wrist fracture 1967   Past Surgical History:  Procedure Laterality Date  . BREAST BIOPSY Right 2012   benign   . BREAST SURGERY  JAN 2012   right breast , BENIGN Rochel Brome  . CESAREAN SECTION     x 2  . COLONOSCOPY N/A 03/26/2016   Procedure: COLONOSCOPY;  Surgeon: Manya Silvas, MD;  Location: [facility] ENDOSCOPY;  Service: Endoscopy;  Laterality: N/A;  . CYSTOSCOPY  WITH STENT PLACEMENT Right 09/12/2015   Procedure: CYSTOSCOPY WITH STENT PLACEMENT;  Surgeon: Ardis Hughs, MD;  Location: [facility] ORS;  Service: Urology;  Laterality: Right;  . CYSTOSCOPY/URETEROSCOPY/HOLMIUM LASER/STENT PLACEMENT Right 09/29/2015   Procedure: CYSTOSCOPY/URETEROSCOPY/HOLMIUM LASER/STENT PLACEMENT/ STENT EXCHANGE;  Surgeon: [name], MD;  Location: [facility] ORS;  Service: Urology;  Laterality: Right;  . EXTRACORPOREAL SHOCK WAVE LITHOTRIPSY Left 06/16/2017   Procedure: EXTRACORPOREAL SHOCK WAVE LITHOTRIPSY (ESWL);  Surgeon: [name], MD;  Location: [facility] ORS;   Service: Urology;  Laterality: Left;  . FOOT SURGERY  2006   [facility]:  silicone implants in both great toes   . FOOT SURGERY Bilateral 0165   silicone implants both great toes  . pilonidal  1979   cyst excised   . STONE EXTRACTION WITH BASKET Right 09/29/2015   Procedure: STONE EXTRACTION WITH BASKET;  Surgeon: [name], MD;  Location: [facility] ORS;  Service: Urology;  Laterality: Right;  . TUBAL LIGATION  1990   Social History   Socioeconomic History  . Marital status: Married    Spouse name: Not on file  . Number of children: 2  . Years of education: Not on file  . Highest education level: Not on file  Occupational History  . Not on file  Social Needs  . Financial resource strain: Not on file  . Food insecurity:    Worry: Not on file    Inability: Not on file  . Transportation needs:    Medical: Not on file    Non-medical: Not on file  Tobacco Use  . Smoking status: Never Smoker  . Smokeless tobacco: Never Used  Substance and Sexual Activity  . Alcohol use: Yes    Comment: occasionally on weekends, 2 glasses of red wine  . Drug use: No   Current Outpatient Medications on File Prior to Visit  Medication Sig Dispense Refill  . Calcium Carb-Cholecalciferol (CALCIUM-VITAMIN D) 500-200 MG-UNIT tablet Take 3 tablets by mouth daily.     No current facility-administered medications on file prior to visit.    No Known Allergies Family History  Problem Relation Age of Onset  . Aneurysm Mother 4       Hemorrhage CVA  . Stroke Mother   . Urolithiasis Mother   . Kidney disease Mother   . Birth defects Maternal Uncle   . Cancer Neg Hx   . Prostate cancer Neg Hx   . Kidney cancer Neg Hx   . Breast cancer Neg Hx    PE: BP 108/70   Pulse 60   Ht 5\' 1"  (1.549 m) Comment: measured  Wt 100 lb 12.8 oz (45.7 kg)   LMP 05/17/2011   SpO2 98%   BMI 19.05 kg/m  Wt Readings from Last 3 Encounters:  03/27/18 100 lb 12.8 oz (45.7 kg)  02/16/18 98 lb 6.4 oz (44.6 kg)    09/20/17 98 lb (44.5 kg)   Constitutional: Thin, fit, in NAD. No kyphosis. Eyes: PERRLA, EOMI, no exophthalmos ENT: moist mucous membranes, no thyromegaly, no cervical lymphadenopathy Cardiovascular: RRR, No MRG Respiratory: CTA B Gastrointestinal: abdomen soft, NT, ND, BS+ Musculoskeletal: no deformities, strength intact in all 4, no pain at spine percussion Skin: moist, warm, no rashes Neurological: no tremor with outstretched hands, DTR normal in all 4  Assessment: 1. Osteoporosis  Plan: 1. Osteoporosis - likely age-related/postmenopausal, she has FH of OP in her father - Discussed about increased risk of fracture, depending on the T score, greatly increased when the T score  is lower than -2.5, but it is actually a continuum and -2.5 should not be regarded as an absolute threshold. We reviewed her DXA scan report together, and I explained that based on the T scores, she has an increased risk for fractures.  - we reviewed her dietary and supplemental calcium and vitamin D intake. I recommended to make sure she gets 1000-1200 mg of calcium daily preferentially from diet but she is now getting much more than this from supplements.  I explained the new guidelines on calcium supplementation recommend the above amount ideally from the diet and only take supplements if not obtained from the diet.  I advised her to reduce her supplement to only 1 tablet a day, but, since her vitamin D level was normal, to try to get the entire amount of vitamin D separately. - discussed fall precautions   - She is doing a good job with exercise: Power yoga, tennis, golf, etc. - I also recommended the [facility] - for skeletal loading. Given brochure and explained benefits. - we discussed about maintaining a good amount of protein in her diet. The recommended daily protein intake is ~0.8 g per kilogram per day (for her approximately 40 g a day). I advised her to try to aim for this amount, since a diet low  in proteins can exacerbate osteoporosis. Also, avoid smoking or >2 drinks of alcohol a day. - I recommended the following book for the idea of low acid eating:  - We discussed about the different medication classes, benefits and side effects (including atypical fractures and ONJ - no dental workup in progress or planned).  - I explained that first options are sq denosumab (Prolia) sq every 6 months for 6-10 years and zoledronic acid (Reclast) iv 1x a year for 1-2 years. I would use Teriparatide/Abaloparatide (which are daily subcutaneous medication) or Romosozumab (monthly) as a last resort.  - Pt was given reading information about Prolia, and I explained the mechanism of action and expected benefits.  She does agree to start this. - She recently had labs and they were all normal including the vitamin D.  We will not recheck them today - will arrange for a Prolia inj - will check a new DXA scan in 2 years after starting Prolia - the first indication that the treatment is working is her not having fractures. DXA scan changes are secondary: unchanged or slightly higher T-scores are desirable - will see pt back in a year  2.  History of vitamin D deficiency -Latest vitamin D level was reviewed and this was normal. -We will decrease the calcium and vitamin D supplementation to only 1 tablet a day containing 600 mg calcium.  She needs to let me know how much vitamin D she is usually taking from her supplement so we can substitute a vitamin D capsule.  Philemon Kingdom, MD PhD [facility]

## 2018-03-27 NOTE — Patient Instructions (Addendum)
We will let you know when you can come for the Prolia injection.  Please decrease the calcium dose to 600 mg daily.  Please come back in 1 year.  How Can I Prevent Falls? Men and women with osteoporosis need to take care not to fall down. Falls can break bones. Some reasons people fall are: Poor vision  Poor balance  Certain diseases that affect how you walk  Some types of medicine, such as sleeping pills.  Some tips to help prevent falls outdoors are: Use a cane or walker  Wear rubber-soled shoes so you don't slip  Walk on grass when sidewalks are slippery  In winter, put salt or kitty litter on icy sidewalks.  Some ways to help prevent falls indoors are: Keep rooms free of clutter, especially on floors  Use plastic or carpet runners on slippery floors  Wear low-heeled shoes that provide good support  Do not walk in socks, stockings, or slippers  Be sure carpets and area rugs have skid-proof backs or are tacked to the floor  Be sure stairs are well lit and have rails on both sides  Put grab bars on bathroom walls near tub, shower, and toilet  Use a rubber bath mat in the shower or tub  Keep a flashlight next to your bed  Use a sturdy step stool with a handrail and wide steps  Add more lights in rooms (and night lights) Buy a cordless phone to keep with you so that you don't have to rush to the phone   when it rings and so that you can call for help if you fall.   (adapted from http://www.niams.[employer].at)  Check out the book:   Denosumab: Patient drug information (Up-to-date) Copyright (253) 756-7640 [employer] All rights reserved.  Brand Names: [place]  ProliaRivka Barbara What is this drug used for?  .It is used to treat soft, brittle bones (osteoporosis).  .It is used for bone growth.  .It is used when treating some cancers.  .It may be given to you for other reasons. Talk with the doctor. What do I need to tell my doctor BEFORE  I take this drug?  All products:  .If you have an allergy to denosumab or any other part of this drug.  .If you are allergic to any drugs like this one, any other drugs, foods, or other substances. Tell your doctor about the allergy and what signs you had, like rash; hives; itching; shortness of breath; wheezing; cough; swelling of face, lips, tongue, or throat; or any other signs.  .If you have low calcium levels.  ProliaT:  .If you are pregnant or may be pregnant. Do not take this drug if you are pregnant.  This is not a list of all drugs or health problems that interact with this drug.  Tell your doctor and pharmacist about all of your drugs (prescription or OTC, natural products, vitamins) and health problems. You must check to make sure that it is safe for you to take this drug with all of your drugs and health problems. Do not start, stop, or change the dose of any drug without checking with your doctor. What are some things I need to know or do while I take this drug?  All products:  .Tell dentists, surgeons, and other doctors that you use this drug.  .This drug may raise the chance of a broken leg. Talk with your doctor.  .Have your blood work checked. Talk with your doctor.  .Have a bone  density test. Talk with your doctor.  .Take calcium and vitamin D as you were told by your doctor.  .Have a dental exam before starting this drug.  .Take good care of your teeth. See a dentist often.  .If you smoke, talk with your doctor.  .Do not give to a child. Talk with your doctor.  .Tell your doctor if you are breast-feeding. You will need to talk about any risks to your baby.  Xgeva:  .This drug may cause harm to the unborn baby if you take it while you are pregnant. If you get pregnant while taking this drug, call your doctor right away.  ProliaT:  .Very bad infections have been reported with use of this drug. If you have any infection, are taking antibiotics now or in the recent past, or  have many infections, talk with your doctor.  .You may have more chance of getting an infection. Wash hands often. Stay away from people with infections, colds, or flu.  .Use birth control that you can trust to prevent pregnancy while taking this drug.  .If you are a man and your sex partner is pregnant or gets pregnant at any time while you are being treated, talk with your doctor. What are some side effects that I need to call my doctor about right away?  WARNING/CAUTION: Even though it may be rare, some people may have very bad and sometimes deadly side effects when taking a drug. Tell your doctor or get medical help right away if you have any of the following signs or symptoms that may be related to a very bad side effect:  All products:  .Signs of an allergic reaction, like rash; hives; itching; red, swollen, blistered, or peeling skin with or without fever; wheezing; tightness in the chest or throat; trouble breathing or talking; unusual hoarseness; or swelling of the mouth, face, lips, tongue, or throat.  .Signs of low calcium levels like muscle cramps or spasms, numbness and tingling, or seizures.  .Mouth sores.  .Any new or strange groin, hip, or thigh pain.  .This drug may cause jawbone problems. The chance may be higher the longer you take this drug. The chance may be higher if you have cancer, dental problems, dentures that do not fit well, anemia, blood clotting problems, or an infection. The chance may also be higher if you are having dental work or if you are getting chemo, some steroid drugs, or radiation. Call your doctor right away if you have jaw swelling or pain.  Xgeva:  .Not hungry.  .Muscle pain or weakness.  .Seizures.  .Shortness of breath.  ProliaT:  .Signs of infection. These include a fever of 100.93F (38C) or higher, chills, very bad sore throat, ear or sinus pain, cough, more sputum or change in color of sputum, pain with passing urine, mouth sores, wound that will  not heal, or anal itching or pain.  .Signs of a pancreas problem (pancreatitis) like very bad stomach pain, very bad back pain, or very bad upset stomach or throwing up.  .Chest pain.  .A heartbeat that does not feel normal.  .Very bad skin irritation.  .Feeling very tired or weak.  .Bladder pain or pain when passing urine or change in how much urine is passed.  .Passing urine often.  .Swelling in the arms or legs. What are some other side effects of this drug?  All drugs may cause side effects. However, many people have no side effects or only have minor side effects. Call your  doctor or get medical help if any of these side effects or any other side effects bother you or do not go away:  Xgeva:  .Feeling tired or weak.  Marland KitchenHeadache.  Marland KitchenUpset stomach or throwing up.  .Loose stools (diarrhea).  .Cough.  ProliaT:  .Back pain.  .Muscle or joint pain.  .Sore throat.  .Runny nose.  .Pain in arms or legs.  These are not all of the side effects that may occur. If you have questions about side effects, call your doctor. Call your doctor for medical advice about side effects.  You may report side effects to your national health agency. How is this drug best taken?  Use this drug as ordered by your doctor. Read and follow the dosing on the label closely.  .It is given as a shot into the fatty part of the skin. What do I do if I miss a dose?  .Call the doctor to find out what to do. How do I store and/or throw out this drug?  Marland KitchenThis drug will be given to you in a hospital or doctor's office. You will not store it at home.  Marland KitchenKeep all drugs out of the reach of children and pets.  .Check with your pharmacist about how to throw out unused drugs.  General drug facts  .If your symptoms or health problems do not get better or if they become worse, call your doctor.  .Do not share your drugs with others and do not take anyone else's drugs.  Marland KitchenKeep a list of all your drugs (prescription, natural  products, vitamins, OTC) with you. Give this list to your doctor.  .Talk with the doctor before starting any new drug, including prescription or OTC, natural products, or vitamins.  .Some drugs may have another patient information leaflet. If you have any questions about this drug, please talk with your doctor, pharmacist, or other health care provider.  .If you think there has been an overdose, call your poison control center or get medical care right away. Be ready to tell or show what was taken, how much, and when it happened.

## 2018-03-29 LAB — INTRINSIC FACTOR ANTIBODIES: INTRINSIC FACTOR: NEGATIVE

## 2018-03-29 LAB — METHYLMALONIC ACID, SERUM: Methylmalonic Acid, Quant: 157 nmol/L (ref 87–318)

## 2018-07-03 ENCOUNTER — Ambulatory Visit
Admission: RE | Admit: 2018-07-03 | Discharge: 2018-07-03 | Disposition: A | Discharge status: Home / Self Care | Payer: BC Managed Care – PPO | Source: Ambulatory Visit | Attending: Internal Medicine | Admitting: Internal Medicine

## 2018-07-03 DIAGNOSIS — Z1239 Encounter for other screening for malignant neoplasm of breast: Secondary | ICD-10-CM | POA: Diagnosis not present

## 2018-09-19 ENCOUNTER — Ambulatory Visit: Payer: BC Managed Care – PPO | Admitting: Urology

## 2019-01-16 ENCOUNTER — Ambulatory Visit: Payer: BC Managed Care – PPO | Admitting: Urology

## 2019-01-31 ENCOUNTER — Other Ambulatory Visit: Payer: Self-pay | Admitting: *Deleted

## 2019-01-31 DIAGNOSIS — N200 Calculus of kidney: Secondary | ICD-10-CM

## 2019-02-01 ENCOUNTER — Ambulatory Visit
Admission: RE | Admit: 2019-02-01 | Discharge: 2019-02-01 | Disposition: A | Discharge status: Home / Self Care | Payer: BC Managed Care – PPO | Attending: Urology | Admitting: Urology

## 2019-02-01 ENCOUNTER — Encounter: Payer: Self-pay | Admitting: Urology

## 2019-02-01 ENCOUNTER — Ambulatory Visit
Admission: RE | Admit: 2019-02-01 | Discharge: 2019-02-01 | Disposition: A | Discharge status: Home / Self Care | Payer: BC Managed Care – PPO | Source: Ambulatory Visit | Attending: Urology | Admitting: Urology

## 2019-02-01 ENCOUNTER — Ambulatory Visit: Payer: BC Managed Care – PPO | Admitting: Urology

## 2019-02-01 ENCOUNTER — Other Ambulatory Visit: Payer: Self-pay

## 2019-02-01 VITALS — BP 127/78 | HR 47 | Ht 61.0 in | Wt 98.0 lb

## 2019-02-01 DIAGNOSIS — N200 Calculus of kidney: Secondary | ICD-10-CM | POA: Diagnosis present

## 2019-02-01 NOTE — Progress Notes (Signed)
02/01/2019 10:32 AM   Megan Mcintosh 1956/04/13 [id number]  Referring provider: [name], MD [address],  [place] [address]  Chief Complaint  Patient presents with  . Follow-up    HPI: 63 year old female followed for history of recurrent nephrolithiasis.  She returns today for routine annual follow-up.  She underwent right-sided ureteroscopy with laser lithotripsy 09/2015 for an obstructing ureteral calculus.  She underwent ESWL in 2019 for a conglomerate of left lower pole stones and which fragmentation occurred but residual fragments remain.  KUB today shows stable bilateral stone disease a 3 mm stone on the left and a few punctate stones on the right.  He has been doing remarkably well this year.  She has been watching her diet and managing her oxalate intake.  She has been pushing fluids.  Presented to citrus and her diet.  She is not had any flank pain or urinary symptoms over the past year.   PMH: Past Medical History:  Diagnosis Date  . Arthritis   . Breast mass, right    benign  . Cervical dysplasia 1980   s/p freezing  . Fatigue   . History of kidney stones 2017, 2019  . Hypercholesteremia   . Nephrolithiasis 1987  . Osteopenia of femoral neck    left  . Pilonidal cyst   . Pinguecula of right eye    removed by Amado Nash  . Vitamin D deficiency 2018  . Wrist fracture 1967    Surgical History: Past Surgical History:  Procedure Laterality Date  . BREAST BIOPSY Right 2012   BENIGN BREAST TISSUE WITH STROMAL FIBROSIS AND FOCAL FIBROADENOMA  . BREAST SURGERY  JAN 2012   right breast , BENIGN Rochel Brome  . CESAREAN SECTION     x 2  . COLONOSCOPY N/A 03/26/2016   Procedure: COLONOSCOPY;  Surgeon: Manya Silvas, MD;  [facility] ENDOSCOPY;  Service: Endoscopy;  Laterality: N/A;  . CYSTOSCOPY WITH STENT PLACEMENT Right 09/12/2015   Procedure: CYSTOSCOPY WITH STENT PLACEMENT;  Surgeon: Ardis Hughs, MD;  Location: [facility]  ORS;  Service: Urology;  Laterality: Right;  . CYSTOSCOPY/URETEROSCOPY/HOLMIUM LASER/STENT PLACEMENT Right 09/29/2015   Procedure: CYSTOSCOPY/URETEROSCOPY/HOLMIUM LASER/STENT PLACEMENT/ STENT EXCHANGE;  Surgeon: [name], MD;  Location: [facility] ORS;  Service: Urology;  Laterality: Right;  . EXTRACORPOREAL SHOCK WAVE LITHOTRIPSY Left 06/16/2017   Procedure: EXTRACORPOREAL SHOCK WAVE LITHOTRIPSY (ESWL);  Surgeon: [name], MD;  Location: [facility] ORS;  Service: Urology;  Laterality: Left;  . FOOT SURGERY  2006   [facility]:  silicone implants in both great toes   . FOOT SURGERY Bilateral [id number]   silicone implants both great toes  . pilonidal  1979   cyst excised   . STONE EXTRACTION WITH BASKET Right 09/29/2015   Procedure: STONE EXTRACTION WITH BASKET;  Surgeon: [name], MD;  Location: [facility] ORS;  Service: Urology;  Laterality: Right;  . TUBAL LIGATION  1990    Home Medications:  Allergies as of 02/01/2019   No Known Allergies     Medication List       Accurate as of February 01, 2019 10:32 AM. If you have any questions, ask your nurse or doctor.        calcium-vitamin D 500-200 MG-UNIT tablet Take 3 tablets by mouth daily.   vitamin B-12 500 MCG tablet Commonly known as: CYANOCOBALAMIN Take 500 mcg by mouth daily.       Allergies: No Known Allergies  Family History: Family History  Problem Relation Age of Onset  . Aneurysm Mother 26       Hemorrhage CVA  . Stroke Mother   . Urolithiasis Mother   . Kidney disease Mother   . Birth defects Maternal Uncle   . Cancer Neg Hx   . Prostate cancer Neg Hx   . Kidney cancer Neg Hx   . Breast cancer Neg Hx     Social History:  reports that she has never smoked. She has never used smokeless tobacco. She reports current alcohol use. She reports that she does not use drugs.  ROS: UROLOGY Frequent Urination?: No Hard to postpone urination?: No Burning/pain with urination?: No Get up at night to urinate?: No  Leakage of urine?: No Urine stream starts and stops?: No Trouble starting stream?: No Do you have to strain to urinate?: No Blood in urine?: No Urinary tract infection?: No Sexually transmitted disease?: No Injury to kidneys or bladder?: No Painful intercourse?: No Weak stream?: No Currently pregnant?: No Vaginal bleeding?: No Last menstrual period?: n  Gastrointestinal Nausea?: No Vomiting?: No Indigestion/heartburn?: No Diarrhea?: No Constipation?: No  Constitutional Fever: No Night sweats?: No Weight loss?: No Fatigue?: No  Skin Skin rash/lesions?: No Itching?: No  Eyes Blurred vision?: No Double vision?: No  Ears/Nose/Throat Sore throat?: No Sinus problems?: No  Hematologic/Lymphatic Swollen glands?: No Easy bruising?: No  Cardiovascular Leg swelling?: No Chest pain?: No  Respiratory Cough?: No Shortness of breath?: No  Endocrine Excessive thirst?: No  Musculoskeletal Back pain?: No Joint pain?: No  Neurological Headaches?: No Dizziness?: No  Psychologic Depression?: No Anxiety?: No  Physical Exam: BP 127/78   Pulse (!) 47   Ht 5\' 1"  (1.549 m)   Wt 98 lb (44.5 kg)   LMP 05/17/2011   BMI 18.52 kg/m   Constitutional:  Alert and oriented, No acute distress. HEENT: [place] AT, moist mucus membranes.  Trachea midline, no masses. Cardiovascular: No clubbing, cyanosis, or edema. Respiratory: Normal respiratory effort, no increased work of breathing. Skin: No rashes, bruises or suspicious lesions. Neurologic: Grossly intact, no focal deficits, moving all 4 extremities. Psychiatric: Normal mood and affect.  Laboratory Data: Lab Results  Component Value Date   WBC 11.3 (H) 06/24/2017   HGB 12.9 06/24/2017   HCT 38.1 06/24/2017   MCV 88.3 06/24/2017   PLT 384 06/24/2017    Lab Results  Component Value Date   CREATININE 0.80 02/16/2018     Lab Results  Component Value Date   HGBA1C 5.6 02/16/2018    Pertinent Imaging: KUB was  personally reviewed today, interpretation as above.  Essentially unchanged from last year. Assessment & Plan:    1. Nephrolithiasis Stable small bilateral kidney disease, unchanged from KUB almost 1.5 years ago  We reviewed stone diet recommendations  Given that she is metabolically stable and asymptomatic, will follow her clinically.  She may follow-up as needed.  [name], MD  [facility] [address] [place], [place] [address] [phone]

## 2019-02-20 ENCOUNTER — Other Ambulatory Visit: Payer: Self-pay

## 2019-02-22 ENCOUNTER — Ambulatory Visit (INDEPENDENT_AMBULATORY_CARE_PROVIDER_SITE_OTHER): Payer: BC Managed Care – PPO | Admitting: Internal Medicine

## 2019-02-22 ENCOUNTER — Other Ambulatory Visit (HOSPITAL_COMMUNITY)
Admission: RE | Admit: 2019-02-22 | Discharge: 2019-02-22 | Disposition: A | Discharge status: Home / Self Care | Payer: BC Managed Care – PPO | Source: Ambulatory Visit | Attending: Internal Medicine | Admitting: Internal Medicine

## 2019-02-22 ENCOUNTER — Other Ambulatory Visit: Payer: Self-pay

## 2019-02-22 ENCOUNTER — Encounter: Payer: Self-pay | Admitting: Internal Medicine

## 2019-02-22 VITALS — BP 96/52 | HR 59 | Temp 96.7°F | Resp 14 | Ht 61.0 in | Wt 97.6 lb

## 2019-02-22 DIAGNOSIS — Z0000 Encounter for general adult medical examination without abnormal findings: Secondary | ICD-10-CM

## 2019-02-22 DIAGNOSIS — E782 Mixed hyperlipidemia: Secondary | ICD-10-CM

## 2019-02-22 DIAGNOSIS — E559 Vitamin D deficiency, unspecified: Secondary | ICD-10-CM

## 2019-02-22 DIAGNOSIS — Z124 Encounter for screening for malignant neoplasm of cervix: Secondary | ICD-10-CM | POA: Insufficient documentation

## 2019-02-22 DIAGNOSIS — E538 Deficiency of other specified B group vitamins: Secondary | ICD-10-CM

## 2019-02-22 DIAGNOSIS — R7301 Impaired fasting glucose: Secondary | ICD-10-CM | POA: Diagnosis not present

## 2019-02-22 LAB — COMPREHENSIVE METABOLIC PANEL
ALT: 9 U/L (ref 0–35)
AST: 17 U/L (ref 0–37)
Albumin: 4.8 g/dL (ref 3.5–5.2)
Alkaline Phosphatase: 83 U/L (ref 39–117)
BUN: 19 mg/dL (ref 6–23)
CO2: 27 mEq/L (ref 19–32)
Calcium: 9.8 mg/dL (ref 8.4–10.5)
Chloride: 102 mEq/L (ref 96–112)
Creatinine, Ser: 0.72 mg/dL (ref 0.40–1.20)
GFR: 81.78 mL/min (ref >60.00)
Glucose, Bld: 83 mg/dL (ref 70–99)
Potassium: 4.4 mEq/L (ref 3.5–5.1)
Sodium: 140 mEq/L (ref 135–145)
Total Bilirubin: 0.7 mg/dL (ref 0.2–1.2)
Total Protein: 6.8 g/dL (ref 6.0–8.3)

## 2019-02-22 LAB — LIPID PANEL
Cholesterol: 243 mg/dL — ABNORMAL HIGH (ref 0–200)
HDL: 119.1 mg/dL (ref >39.00)
LDL Cholesterol: 113 mg/dL — ABNORMAL HIGH (ref 0–99)
NonHDL: 124.2
Total CHOL/HDL Ratio: 2
Triglycerides: 56 mg/dL (ref 0.0–149.0)
VLDL: 11.2 mg/dL (ref 0.0–40.0)

## 2019-02-22 LAB — VITAMIN B12: Vitamin B-12: >1500 pg/mL — ABNORMAL HIGH (ref 211–911)

## 2019-02-22 LAB — VITAMIN D 25 HYDROXY (VIT D DEFICIENCY, FRACTURES): VITD: 59.74 ng/mL (ref 30.00–100.00)

## 2019-02-22 LAB — TSH: TSH: 2.1 u[IU]/mL (ref 0.35–4.50)

## 2019-02-22 NOTE — Progress Notes (Signed)
Patient ID: Megan Mcintosh, female    DOB: 17-Feb-1956  Age: 63 y.o. MRN: [id number]  The patient is here for annual preventive examination and management of other chronic and acute problems.  DUE FOR PAP SMEAR   The risk factors are reflected in the social history.  The roster of all physicians providing medical care to patient - is listed in the Snapshot section of the chart.  Activities of daily living:  The patient is 100% independent in all ADLs: dressing, toileting, feeding as well as independent mobility  Home safety : The patient has smoke detectors in the home. They wear seatbelts.  There are no firearms at home. There is no violence in the home.   There is no risks for hepatitis, STDs or HIV. There is no   history of blood transfusion. They have no travel history to infectious disease endemic areas of the world.  The patient has seen their dentist in the last six month. They have seen their eye doctor in the last year. .  They have deferred audiologic testing in the last year.  They do not  have excessive sun exposure. Discussed the need for sun protection: hats, long sleeves and use of sunscreen if there is significant sun exposure.   Diet: the importance of a healthy diet is discussed. They do have a healthy diet.  The benefits of regular aerobic exercise were discussed. She plays tennis 3 to 4 times per week and does yoga on the other days    Depression screen: there are no signs or vegative symptoms of depression- irritability, change in appetite, anhedonia, sadness/tearfullness.  The following portions of the patient's history were reviewed and updated as appropriate: allergies, current medications, past family history, past medical history,  past surgical history, past social history  and problem list.  Visual acuity was not assessed per patient preference since she has regular follow up with her ophthalmologist. Hearing and body mass index were assessed and reviewed.   During  the course of the visit the patient was educated and counseled about appropriate screening and preventive services including : fall prevention , diabetes screening, nutrition counseling, colorectal cancer screening, and recommended immunizations.    CC: There were no encounter diagnoses.  History Megan Mcintosh has a past medical history of Arthritis, Breast mass, right, Cervical dysplasia (1980), Fatigue, History of kidney stones (2017, 2019), Hypercholesteremia, Nephrolithiasis (1987), Osteopenia of femoral neck, Pilonidal cyst, Pinguecula of right eye, Vitamin D deficiency (2018), and Wrist fracture (1967).   She has a past surgical history that includes Foot surgery (2006); Tubal ligation (1990); Cesarean section; pilonidal (1979); Cystoscopy with stent placement (Right, 09/12/2015); Cystoscopy/ureteroscopy/holmium laser/stent placement (Right, 09/29/2015); Stone extraction with basket (Right, 09/29/2015); Colonoscopy (N/A, 03/26/2016); Breast surgery (JAN 2012); Foot surgery (Bilateral, 2006); Extracorporeal shock wave lithotripsy (Left, 06/16/2017); and Breast biopsy (Right, 2012).   Her family history includes Aneurysm (age of onset: 23) in her mother; Birth defects in her maternal uncle; Kidney disease in her mother; Stroke in her mother; Urolithiasis in her mother.She reports that she has never smoked. She has never used smokeless tobacco. She reports current alcohol use. She reports that she does not use drugs.  Outpatient Medications Prior to Visit  Medication Sig Dispense Refill  . Calcium Carb-Cholecalciferol (CALCIUM-VITAMIN D) 500-200 MG-UNIT tablet Take 3 tablets by mouth daily.    . cholecalciferol (VITAMIN D) 25 MCG (1000 UT) tablet Take 1,000 Units by mouth daily.    . vitamin B-12 (CYANOCOBALAMIN) 500 MCG tablet Take  500 mcg by mouth daily.     No facility-administered medications prior to visit.     Review of Systems    Patient denies headache, fevers, malaise, unintentional weight loss,  skin rash, eye pain, sinus congestion and sinus pain, sore throat, dysphagia,  hemoptysis , cough, dyspnea, wheezing, chest pain, palpitations, orthopnea, edema, abdominal pain, nausea, melena, diarrhea, constipation, flank pain, dysuria, hematuria, urinary  Frequency, nocturia, numbness, tingling, seizures,  Focal weakness, Loss of consciousness,  Tremor, insomnia, depression, anxiety, and suicidal ideation.      Objective:  BP (!) 96/52 (BP Location: Left Arm, Patient Position: Sitting, Cuff Size: Normal)   Pulse (!) 59   Temp (!) 96.7 F (35.9 C) (Temporal)   Resp 14   Ht 5\' 1"  (1.549 m)   Wt 97 lb 9.6 oz (44.3 kg)   LMP 05/17/2011   SpO2 99%   BMI 18.44 kg/m   Physical Exam  General Appearance:    Alert, cooperative, no distress, appears stated age  Head:    Normocephalic, without obvious abnormality, atraumatic  Eyes:    PERRL, conjunctiva/corneas clear, EOM's intact, fundi    benign, both eyes  Ears:    Normal TM's and external ear canals, both ears  Nose:   Nares normal, septum midline, mucosa normal, no drainage    or sinus tenderness  Throat:   Lips, mucosa, and tongue normal; teeth and gums normal  Neck:   Supple, symmetrical, trachea midline, no adenopathy;    thyroid:  no enlargement/tenderness/nodules; no carotid   bruit or JVD  Back:     Symmetric, no curvature, ROM normal, no CVA tenderness  Lungs:     Clear to auscultation bilaterally, respirations unlabored  Chest Kercheval:    No tenderness or deformity   Heart:    Regular rate and rhythm, S1 and S2 normal, no murmur, rub   or gallop  Breast Exam:    No tenderness, masses, or nipple abnormality  Abdomen:     Soft, non-tender, bowel sounds active all four quadrants,    no masses, no organomegaly  Genitalia:    Pelvic: cervix normal in appearance, external genitalia normal, no adnexal masses or tenderness, no cervical motion tenderness, rectovaginal septum normal, uterus normal size, shape, and consistency and vagina  normal without discharge  Extremities:   Extremities normal, atraumatic, no cyanosis or edema  Pulses:   2+ and symmetric all extremities  Skin:   Skin color, texture, turgor normal, no rashes or lesions  Lymph nodes:   Cervical, supraclavicular, and axillary nodes normal  Neurologic:   CNII-XII intact, normal strength, sensation and reflexes    throughout    Assessment & Plan:   Problem List Items Addressed This Visit    None      I am having Megan Mcintosh "SUSIE" maintain her calcium-vitamin D, vitamin B-12, and cholecalciferol.  No orders of the defined types were placed in this encounter.   There are no discontinued medications.  Follow-up: No follow-ups on file.   [name], MD

## 2019-02-22 NOTE — Patient Instructions (Signed)

## 2019-02-23 LAB — FOLATE RBC: RBC Folate: 664 ng/mL RBC (ref >280)

## 2019-02-24 NOTE — Assessment & Plan Note (Signed)

## 2019-02-26 LAB — CYTOLOGY - PAP
Diagnosis: NEGATIVE
High risk HPV: NEGATIVE

## 2019-04-02 ENCOUNTER — Ambulatory Visit: Payer: BC Managed Care – PPO | Admitting: Internal Medicine

## 2019-04-03 ENCOUNTER — Ambulatory Visit: Payer: BC Managed Care – PPO | Admitting: Internal Medicine

## 2019-05-28 ENCOUNTER — Other Ambulatory Visit: Payer: Self-pay | Admitting: Internal Medicine

## 2019-05-28 DIAGNOSIS — Z1231 Encounter for screening mammogram for malignant neoplasm of breast: Secondary | ICD-10-CM

## 2019-06-28 ENCOUNTER — Encounter: Payer: Self-pay | Admitting: Physician Assistant

## 2019-06-28 ENCOUNTER — Ambulatory Visit
Admission: RE | Admit: 2019-06-28 | Discharge: 2019-06-28 | Disposition: A | Discharge status: Home / Self Care | Payer: BC Managed Care – PPO | Source: Ambulatory Visit | Attending: Physician Assistant | Admitting: Physician Assistant

## 2019-06-28 ENCOUNTER — Other Ambulatory Visit: Payer: Self-pay

## 2019-06-28 ENCOUNTER — Ambulatory Visit
Admission: RE | Admit: 2019-06-28 | Discharge: 2019-06-28 | Disposition: A | Discharge status: Home / Self Care | Payer: BC Managed Care – PPO | Attending: Urology | Admitting: Urology

## 2019-06-28 ENCOUNTER — Ambulatory Visit (INDEPENDENT_AMBULATORY_CARE_PROVIDER_SITE_OTHER): Payer: BC Managed Care – PPO | Admitting: Physician Assistant

## 2019-06-28 ENCOUNTER — Telehealth: Payer: Self-pay

## 2019-06-28 VITALS — BP 136/77 | HR 76 | Ht 61.0 in | Wt 99.0 lb

## 2019-06-28 DIAGNOSIS — N200 Calculus of kidney: Secondary | ICD-10-CM

## 2019-06-28 MED ORDER — TAMSULOSIN HCL 0.4 MG PO CAPS: 0.4000 mg | ORAL_CAPSULE | Freq: Every day | ORAL | 0 refills | Status: DC

## 2019-06-28 NOTE — Progress Notes (Signed)
06/28/2019 3:41 PM   Megan Mcintosh 04-Sep-1955 [id number]  CC: Pelvic pressure, right flank pain, gross hematuria  HPI: Megan Mcintosh is a 64 y.o. female who presents to the clinic for evaluation of possible acute stone episode. She is an established [facility] patient who last saw Dr. Erlene Quan on 02/01/2019 for annual follow-up of recurrent nephrolithiasis.  KUB that day showed stable bilateral stone disease.  Today, she reports a 1 day history of intermittent right flank pressure and pain, bladder pressure and throbbing, and gross hematuria.  She denies nausea, vomiting, fever, and chills.  She has taken Tylenol for symptom palliation with adequate relief and states the pain overall has been quite manageable.  Of note, today's KUB revealed migration of 4 mm right renal stone into the pelvis, consistent with location in the UVJ or bladder. In-office UA today positive for 3+ blood, 1+ protein, and 1+ leukocyte esterase; urine microscopy with 6-10 WBCs/HPF, >30 RBCs/HPF, and moderate bacteria.  PMH: Past Medical History:  Diagnosis Date  . Arthritis   . Breast mass, right    benign  . Cervical dysplasia 1980   s/p freezing  . Fatigue   . History of kidney stones 2017, 2019  . Hypercholesteremia   . Nephrolithiasis 1987  . Osteopenia of femoral neck    left  . Pilonidal cyst   . Pinguecula of right eye    removed by Amado Nash  . Vitamin D deficiency 2018  . Wrist fracture 1967    Surgical History: Past Surgical History:  Procedure Laterality Date  . BREAST BIOPSY Right 2012   BENIGN BREAST TISSUE WITH STROMAL FIBROSIS AND FOCAL FIBROADENOMA  . BREAST SURGERY  JAN 2012   right breast , BENIGN Rochel Brome  . CESAREAN SECTION     x 2  . COLONOSCOPY N/A 03/26/2016   Procedure: COLONOSCOPY;  Surgeon: Manya Silvas, MD;  Location: [facility] ENDOSCOPY;  Service: Endoscopy;  Laterality: N/A;  . CYSTOSCOPY WITH STENT PLACEMENT Right 09/12/2015   Procedure: CYSTOSCOPY WITH STENT  PLACEMENT;  Surgeon: Ardis Hughs, MD;  Location: [facility] ORS;  Service: Urology;  Laterality: Right;  . CYSTOSCOPY/URETEROSCOPY/HOLMIUM LASER/STENT PLACEMENT Right 09/29/2015   Procedure: CYSTOSCOPY/URETEROSCOPY/HOLMIUM LASER/STENT PLACEMENT/ STENT EXCHANGE;  Surgeon: [name], MD;  Location: [facility] ORS;  Service: Urology;  Laterality: Right;  . EXTRACORPOREAL SHOCK WAVE LITHOTRIPSY Left 06/16/2017   Procedure: EXTRACORPOREAL SHOCK WAVE LITHOTRIPSY (ESWL);  Surgeon: [name], MD;  Location: [facility] ORS;  Service: Urology;  Laterality: Left;  . FOOT SURGERY  2006   [facility]:  silicone implants in both great toes   . FOOT SURGERY Bilateral 123456   silicone implants both great toes  . pilonidal  1979   cyst excised   . STONE EXTRACTION WITH BASKET Right 09/29/2015   Procedure: STONE EXTRACTION WITH BASKET;  Surgeon: [name], MD;  Location: [facility] ORS;  Service: Urology;  Laterality: Right;  . TUBAL LIGATION  1990    Home Medications:  Allergies as of 06/28/2019   No Known Allergies     Medication List       Accurate as of June 28, 2019  3:41 PM. If you have any questions, ask your nurse or doctor.        calcium-vitamin D 500-200 MG-UNIT tablet Take 3 tablets by mouth daily.   cholecalciferol 25 MCG (1000 UNIT) tablet Commonly known as: VITAMIN D Take 1,000 Units by mouth daily.   vitamin B-12 500 MCG tablet Commonly known as: CYANOCOBALAMIN  Take 500 mcg by mouth daily.       Allergies: No Known Allergies  Family History: Family History  Problem Relation Age of Onset  . Aneurysm Mother 55       Hemorrhage CVA  . Stroke Mother   . Urolithiasis Mother   . Kidney disease Mother   . Birth defects Maternal Uncle   . Cancer Neg Hx   . Prostate cancer Neg Hx   . Kidney cancer Neg Hx   . Breast cancer Neg Hx     Social History:  reports that she has never smoked. She has never used smokeless tobacco. She reports current alcohol use. She reports that she  does not use drugs.  ROS: UROLOGY Frequent Urination?: No Hard to postpone urination?: No Burning/pain with urination?: No Get up at night to urinate?: No Leakage of urine?: No Urine stream starts and stops?: No Trouble starting stream?: No Do you have to strain to urinate?: No Blood in urine?: Yes Urinary tract infection?: No Sexually transmitted disease?: No Injury to kidneys or bladder?: No Painful intercourse?: No Weak stream?: No Currently pregnant?: No Vaginal bleeding?: No Last menstrual period?: n  Gastrointestinal Nausea?: No Vomiting?: No Indigestion/heartburn?: No Diarrhea?: No Constipation?: No  Constitutional Fever: No Night sweats?: No Weight loss?: No Fatigue?: No  Skin Skin rash/lesions?: No Itching?: No  Eyes Blurred vision?: No Double vision?: No  Ears/Nose/Throat Sore throat?: No Sinus problems?: No  Hematologic/Lymphatic Swollen glands?: No Easy bruising?: No  Cardiovascular Leg swelling?: No Chest pain?: No  Respiratory Cough?: No Shortness of breath?: No  Endocrine Excessive thirst?: No  Musculoskeletal Back pain?: No Joint pain?: No  Neurological Headaches?: No Dizziness?: No  Psychologic Depression?: No Anxiety?: No  Physical Exam: BP 136/77   Pulse 76   Ht 5\' 1"  (1.549 m)   Wt 99 lb (44.9 kg)   LMP 05/17/2011   BMI 18.71 kg/m   Constitutional:  Alert and oriented, No acute distress. HEENT: [place] AT, moist mucus membranes.  Trachea midline, no masses. Cardiovascular: No clubbing, cyanosis, or edema. Respiratory: Normal respiratory effort, no increased work of breathing. Skin: No rashes, bruises or suspicious lesions. Neurologic: Grossly intact, no focal deficits, moving all 4 extremities. Psychiatric: Normal mood and affect.  Laboratory Data: Results for orders placed or performed in visit on 06/28/19  Microscopic Examination   URINE  Result Value Ref Range   WBC, UA 6-10 (A) 0 - 5 /hpf   RBC >30 (A)  0 - 2 /hpf   Epithelial Cells (non renal) 0-10 0 - 10 /hpf   Casts Present (A) None seen /lpf   Cast Type Hyaline casts N/A   Crystals Present (A) N/A   Crystal Type Amorphous Sediment N/A   Mucus, UA Present (A) Not Estab.   Bacteria, UA Moderate (A) None seen/Few  Urinalysis, Complete  Result Value Ref Range   Specific Gravity, UA 1.020 1.005 - 1.030   pH, UA 7.0 5.0 - 7.5   Color, UA Yellow Yellow   Appearance Ur Cloudy (A) Clear   Leukocytes,UA 1+ (A) Negative   Protein,UA 1+ (A) Negative/Trace   Glucose, UA Negative Negative   Ketones, UA Negative Negative   RBC, UA 3+ (A) Negative   Bilirubin, UA Negative Negative   Urobilinogen, Ur 1.0 0.2 - 1.0 mg/dL   Nitrite, UA Negative Negative   Microscopic Examination See below:    Pertinent Imaging: KUB, 06/28/2019: CLINICAL DATA:  Nephrolithiasis.  EXAM: ABDOMEN - 1 VIEW  COMPARISON:  02/01/2019  FINDINGS: Small calcifications project over the lower poles of both kidneys. There is a new 4 mm calcification in the right hemipelvis concerning for distal right ureteral stone. Additional calcifications project over the patient's pelvis that appears stable from prior study and likely represent phleboliths. There is hemi sacralization of the L5 vertebral body on the left. There is a moderate stool burden. The bowel gas pattern is nonobstructive.  IMPRESSION: 1. New 4 mm calcification in the right hemipelvis concerning for distal right ureteral stone. 2. Stable bilateral nephrolithiasis. 3. Moderate stool burden throughout the colon.  These results will be called to the ordering clinician or representative by the Radiologist Assistant, and communication documented in the PACS or zVision Dashboard.   Electronically Signed   By: Constance Holster M.D.   On: 06/29/2019 00:16  I personally reviewed the images referenced above and note migration of a 4 mm right renal stone into the pelvis, consistent with positioning  at the UVJ or within the bladder.  Assessment & Plan:   1. Nephrolithiasis 64 year old female with a history of recurrent nephrolithiasis presents today with a 1 day history of intermittent right flank pressure and pain, bladder pressure and throbbing, and gross hematuria managed with Tylenol.  KUB consistent with acute stone episode.  UA reassuring for infection, will send for culture to confirm.  Given stone size and location, I recommend we proceed with trial of passage at this time.  I counseled patient to start daily Flomax, increase p.o. fluids, strain her urine, and return to clinic in 1 month for follow-up KUB.  Pain well managed on Tylenol; will defer other pain medications at this time.  Counseled patient to contact the office if she develops worsened pain.  She expressed understanding. - Urinalysis, Complete - tamsulosin (FLOMAX) 0.4 MG CAPS capsule; Take 1 capsule (0.4 mg total) by mouth daily.  Dispense: 30 capsule; Refill: 0 - DG Abd 1 View; Future - CULTURE, URINE COMPREHENSIVE   Return in about 4 weeks (around 07/26/2019) for Trial of passage follow-up with KUB prior.   Debroah Loop, PA-C  [facility] [address], [place], [place] [address] [phone]

## 2019-06-28 NOTE — Patient Instructions (Signed)
1. Start Flomax once daily before bed 2. Increase your fluid intake 3. Strain your urine 4. Treat your pain with Tylenol; contact our office if your pain worsens 5. We will plan to see you again in clinic in 1 month with another abdominal X-ray to prove passage of your stone.

## 2019-06-28 NOTE — Telephone Encounter (Signed)
Patient called stating that she is having urgency, frequency and pressure and flank pain. She believes she is starting to pass another stone. She was added on to PA scheduled for further evaluation she will go for KUB prior to visit today

## 2019-06-28 NOTE — Addendum Note (Signed)
Addended by: Tommy Rainwater on: 06/28/2019 03:00 PM   Modules accepted: Orders

## 2019-06-29 LAB — MICROSCOPIC EXAMINATION: RBC, Urine: >30 /hpf — AB (ref 0–2)

## 2019-06-29 LAB — URINALYSIS, COMPLETE
Bilirubin, UA: NEGATIVE
Glucose, UA: NEGATIVE
Ketones, UA: NEGATIVE
Nitrite, UA: NEGATIVE
Specific Gravity, UA: 1.02 (ref 1.005–1.030)
Urobilinogen, Ur: 1 mg/dL (ref 0.2–1.0)
pH, UA: 7 (ref 5.0–7.5)

## 2019-07-02 LAB — CULTURE, URINE COMPREHENSIVE

## 2019-07-05 ENCOUNTER — Ambulatory Visit
Admission: RE | Admit: 2019-07-05 | Discharge: 2019-07-05 | Disposition: A | Discharge status: Home / Self Care | Payer: BC Managed Care – PPO | Source: Ambulatory Visit | Attending: Internal Medicine | Admitting: Internal Medicine

## 2019-07-05 DIAGNOSIS — Z1231 Encounter for screening mammogram for malignant neoplasm of breast: Secondary | ICD-10-CM | POA: Insufficient documentation

## 2019-07-26 ENCOUNTER — Ambulatory Visit: Payer: Self-pay | Admitting: Physician Assistant

## 2019-08-02 ENCOUNTER — Other Ambulatory Visit: Payer: Self-pay

## 2019-08-02 ENCOUNTER — Ambulatory Visit
Admission: RE | Admit: 2019-08-02 | Discharge: 2019-08-02 | Disposition: A | Discharge status: Home / Self Care | Payer: BC Managed Care – PPO | Attending: Physician Assistant | Admitting: Physician Assistant

## 2019-08-02 ENCOUNTER — Ambulatory Visit: Payer: BC Managed Care – PPO | Admitting: Physician Assistant

## 2019-08-02 ENCOUNTER — Ambulatory Visit
Admission: RE | Admit: 2019-08-02 | Discharge: 2019-08-02 | Disposition: A | Discharge status: Home / Self Care | Payer: BC Managed Care – PPO | Source: Ambulatory Visit | Attending: Physician Assistant | Admitting: Physician Assistant

## 2019-08-02 ENCOUNTER — Encounter: Payer: Self-pay | Admitting: Physician Assistant

## 2019-08-02 VITALS — BP 117/73 | HR 67 | Ht 61.0 in | Wt 99.0 lb

## 2019-08-02 DIAGNOSIS — N200 Calculus of kidney: Secondary | ICD-10-CM | POA: Insufficient documentation

## 2019-08-02 DIAGNOSIS — N201 Calculus of ureter: Secondary | ICD-10-CM | POA: Diagnosis not present

## 2019-08-02 NOTE — Progress Notes (Signed)
08/02/2019 10:14 AM   Megan Mcintosh 1956/01/28 [id number]  CC: Acute stone episode follow-up  HPI: Megan Mcintosh is a 64 y.o. female with PMH nephrolithiasis who presents today for 1 month reevaluation on a trial of passage.  I saw her in clinic on 06/28/2019 with reports of intermittent right flank pressure and pain, bladder pressure and throbbing, and gross hematuria.  KUB revealed migration of a 4 mm right renal stone into the pelvis, consistent with location in the UVJ or bladder.  UA at that time notable for gross hematuria and pyuria, urine culture ultimately resulted negative.  We elected to proceed with a trial of passage at that time.  She reached out to me via MyChart the following day to report that she had passed the stone.  Today, she reports passing a stone when she arrived home from clinic the afternoon that I saw her.  She was able to capture it and brings it to clinic with her today.  She denies dysuria, urgency, frequency, flank pain, and gross hematuria since stone passage.  No acute concerns.  She passed the stone before she had a chance to fill her prescribed Flomax.  Follow-up KUB today reveals resolution of the right-sided pelvic stone.  PMH: Past Medical History:  Diagnosis Date  . Arthritis   . Breast mass, right    benign  . Cervical dysplasia 1980   s/p freezing  . Fatigue   . History of kidney stones 2017, 2019  . Hypercholesteremia   . Nephrolithiasis 1987  . Osteopenia of femoral neck    left  . Pilonidal cyst   . Pinguecula of right eye    removed by Amado Nash  . Vitamin D deficiency 2018  . Wrist fracture 1967    Surgical History: Past Surgical History:  Procedure Laterality Date  . BREAST BIOPSY Right 2012   BENIGN BREAST TISSUE WITH STROMAL FIBROSIS AND FOCAL FIBROADENOMA  . BREAST SURGERY  JAN 2012   right breast , BENIGN Rochel Brome  . CESAREAN SECTION     x 2  . COLONOSCOPY N/A 03/26/2016   Procedure: COLONOSCOPY;  Surgeon:  Manya Silvas, MD;  Location: [facility] ENDOSCOPY;  Service: Endoscopy;  Laterality: N/A;  . CYSTOSCOPY WITH STENT PLACEMENT Right 09/12/2015   Procedure: CYSTOSCOPY WITH STENT PLACEMENT;  Surgeon: Ardis Hughs, MD;  Location: [facility] ORS;  Service: Urology;  Laterality: Right;  . CYSTOSCOPY/URETEROSCOPY/HOLMIUM LASER/STENT PLACEMENT Right 09/29/2015   Procedure: CYSTOSCOPY/URETEROSCOPY/HOLMIUM LASER/STENT PLACEMENT/ STENT EXCHANGE;  Surgeon: [name], MD;  Location: [facility] ORS;  Service: Urology;  Laterality: Right;  . EXTRACORPOREAL SHOCK WAVE LITHOTRIPSY Left 06/16/2017   Procedure: EXTRACORPOREAL SHOCK WAVE LITHOTRIPSY (ESWL);  Surgeon: [name], MD;  Location: [facility] ORS;  Service: Urology;  Laterality: Left;  . FOOT SURGERY  2006   [facility]:  silicone implants in both great toes   . FOOT SURGERY Bilateral [id number]   silicone implants both great toes  . pilonidal  1979   cyst excised   . STONE EXTRACTION WITH BASKET Right 09/29/2015   Procedure: STONE EXTRACTION WITH BASKET;  Surgeon: [name], MD;  Location: [facility] ORS;  Service: Urology;  Laterality: Right;  . TUBAL LIGATION  1990    Home Medications:  Allergies as of 08/02/2019   No Known Allergies     Medication List       Accurate as of August 02, 2019 10:14 AM. If you have any questions, ask your nurse or doctor.  STOP taking these medications   tamsulosin 0.4 MG Caps capsule Commonly known as: FLOMAX Stopped by: Debroah Loop, PA-C     TAKE these medications   calcium-vitamin D 500-200 MG-UNIT tablet Take 1 tablet by mouth daily.   cholecalciferol 25 MCG (1000 UNIT) tablet Commonly known as: VITAMIN D Take 1,000 Units by mouth daily.   vitamin B-12 500 MCG tablet Commonly known as: CYANOCOBALAMIN Take 500 mcg by mouth daily.       Allergies:  No Known Allergies  Family History: Family History  Problem Relation Age of Onset  . Aneurysm Mother 43       Hemorrhage CVA  . Stroke  Mother   . Urolithiasis Mother   . Kidney disease Mother   . Birth defects Maternal Uncle   . Cancer Neg Hx   . Prostate cancer Neg Hx   . Kidney cancer Neg Hx   . Breast cancer Neg Hx     Social History:   reports that she has never smoked. She has never used smokeless tobacco. She reports current alcohol use. She reports that she does not use drugs.  Physical Exam: BP 117/73 (BP Location: Left Arm, Patient Position: Sitting, Cuff Size: Normal)   Pulse 67   Ht 5\' 1"  (1.549 m)   Wt 99 lb (44.9 kg)   LMP 05/17/2011   BMI 18.71 kg/m   Constitutional:  Alert and oriented, no acute distress, nontoxic appearing HEENT: [place], AT Cardiovascular: No clubbing, cyanosis, or edema Respiratory: Normal respiratory effort, no increased work of breathing Skin: No rashes, bruises or suspicious lesions Neurologic: Grossly intact, no focal deficits, moving all 4 extremities Psychiatric: Normal mood and affect  Pertinent Imaging: KUB, 08/02/2019: CLINICAL DATA:  Nephrolithiasis  EXAM: ABDOMEN - 1 VIEW  COMPARISON:  06/28/2019  FINDINGS: The bowel gas pattern is normal. Redemonstration of multiple small punctate stones projecting over the bilateral renal shadows compatible with known nephrolithiasis. Similar appearance in size and number compared to the prior study. Multiple pelvic phleboliths without evidence of new intrapelvic calcification to suggest distal ureteral or bladder stone. Moderate volume of stool throughout the colon.  IMPRESSION: Redemonstration of multiple small punctate stones projecting over the bilateral renal shadows compatible with known nephrolithiasis.   Electronically Signed   By: Davina Poke D.O.   On: 08/02/2019 14:36  I personally reviewed the images referenced above and note resolution of the right ureteral stone.  Assessment & Plan:   1. Right ureteral stone Resolved per KUB.  Will send stone for analysis and contact patient with results.   Patient prefers to follow-up in clinic as needed.  I am in agreement with this plan.  Reiterated general stone prevention guidelines including maintaining a low oxalate diet and increasing p.o. hydration.  Will provide more tailored recommendations as indicated per stone analysis.  Return if symptoms worsen or fail to improve.  Debroah Loop, PA-C  [facility] [address] [place], [place] [address] [phone]

## 2019-08-02 NOTE — Patient Instructions (Signed)
We will call you with your stone analysis results   Dietary Guidelines to Help Prevent Kidney Stones Kidney stones are deposits of minerals and salts that form inside your kidneys. Your risk of developing kidney stones may be greater depending on your diet, your lifestyle, the medicines you take, and whether you have certain medical conditions. Most people can reduce their chances of developing kidney stones by following the instructions below. Depending on your overall health and the type of kidney stones you tend to develop, your dietitian may give you more specific instructions. What are tips for following this plan? Reading food labels  Choose foods with "no salt added" or "low-salt" labels. Limit your sodium intake to less than 1500 mg per day.  Choose foods with calcium for each meal and snack. Try to eat about 300 mg of calcium at each meal. Foods that contain 200-500 mg of calcium per serving include: ? 8 oz (237 ml) of milk, fortified nondairy milk, and fortified fruit juice. ? 8 oz (237 ml) of kefir, yogurt, and soy yogurt. ? 4 oz (118 ml) of tofu. ? 1 oz of cheese. ? 1 cup (300 g) of dried figs. ? 1 cup (91 g) of cooked broccoli. ? 1-3 oz can of sardines or mackerel.  Most people need 1000 to 1500 mg of calcium each day. Talk to your dietitian about how much calcium is recommended for you. Shopping  Buy plenty of fresh fruits and vegetables. Most people do not need to avoid fruits and vegetables, even if they contain nutrients that may contribute to kidney stones.  When shopping for convenience foods, choose: ? Whole pieces of fruit. ? Premade salads with dressing on the side. ? Low-fat fruit and yogurt smoothies.  Avoid buying frozen meals or prepared deli foods.  Look for foods with live cultures, such as yogurt and kefir. Cooking  Do not add salt to food when cooking. Place a salt shaker on the table and allow each person to add his or her own salt to taste.  Use  vegetable protein, such as beans, textured vegetable protein (TVP), or tofu instead of meat in pasta, casseroles, and soups. Meal planning   Eat less salt, if told by your dietitian. To do this: ? Avoid eating processed or premade food. ? Avoid eating fast food.  Eat less animal protein, including cheese, meat, poultry, or fish, if told by your dietitian. To do this: ? Limit the number of times you have meat, poultry, fish, or cheese each week. Eat a diet free of meat at least 2 days a week. ? Eat only one serving each day of meat, poultry, fish, or seafood. ? When you prepare animal protein, cut pieces into small portion sizes. For most meat and fish, one serving is about the size of one deck of cards.  Eat at least 5 servings of fresh fruits and vegetables each day. To do this: ? Keep fruits and vegetables on hand for snacks. ? Eat 1 piece of fruit or a handful of berries with breakfast. ? Have a salad and fruit at lunch. ? Have two kinds of vegetables at dinner.  Limit foods that are high in a substance called oxalate. These include: ? Spinach. ? Rhubarb. ? Beets. ? Potato chips and french fries. ? Nuts.  If you regularly take a diuretic medicine, make sure to eat at least 1-2 fruits or vegetables high in potassium each day. These include: ? Avocado. ? Banana. ? Orange, prune, carrot, or  tomato juice. ? Baked potato. ? Cabbage. ? Beans and split peas. General instructions   Drink enough fluid to keep your urine clear or pale yellow. This is the most important thing you can do.  Talk to your health care provider and dietitian about taking daily supplements. Depending on your health and the cause of your kidney stones, you may be advised: ? Not to take supplements with vitamin C. ? To take a calcium supplement. ? To take a daily probiotic supplement. ? To take other supplements such as magnesium, fish oil, or vitamin B6.  Take all medicines and supplements as told by your  health care provider.  Limit alcohol intake to no more than 1 drink a day for nonpregnant women and 2 drinks a day for men. One drink equals 12 oz of beer, 5 oz of wine, or 1 oz of hard liquor.  Lose weight if told by your health care provider. Work with your dietitian to find strategies and an eating plan that works best for you. What foods are not recommended? Limit your intake of the following foods, or as told by your dietitian. Talk to your dietitian about specific foods you should avoid based on the type of kidney stones and your overall health. Grains Breads. Bagels. Rolls. Baked goods. Salted crackers. Cereal. Pasta. Vegetables Spinach. Rhubarb. Beets. Canned vegetables. Angie Fava. Olives. Meats and other protein foods Nuts. Nut butters. Large portions of meat, poultry, or fish. Salted or cured meats. Deli meats. Hot dogs. Sausages. Dairy Cheese. Beverages Regular soft drinks. Regular vegetable juice. Seasonings and other foods Seasoning blends with salt. Salad dressings. Canned soups. Soy sauce. Ketchup. Barbecue sauce. Canned pasta sauce. Casseroles. Pizza. Lasagna. Frozen meals. Potato chips. [place] fries. Summary  You can reduce your risk of kidney stones by making changes to your diet.  The most important thing you can do is drink enough fluid. You should drink enough fluid to keep your urine clear or pale yellow.  Ask your health care provider or dietitian how much protein from animal sources you should eat each day, and also how much salt and calcium you should have each day. This information is not intended to replace advice given to you by your health care provider. Make sure you discuss any questions you have with your health care provider. Document Revised: 08/30/2018 Document Reviewed: 04/20/2016 Elsevier Patient Education  2020 [employer].

## 2020-03-06 ENCOUNTER — Other Ambulatory Visit: Payer: Self-pay

## 2020-03-06 ENCOUNTER — Ambulatory Visit (INDEPENDENT_AMBULATORY_CARE_PROVIDER_SITE_OTHER): Payer: BC Managed Care – PPO | Admitting: Internal Medicine

## 2020-03-06 ENCOUNTER — Encounter: Payer: Self-pay | Admitting: Internal Medicine

## 2020-03-06 VITALS — BP 118/66 | HR 71 | Temp 97.5°F | Resp 14 | Ht 60.5 in | Wt 96.4 lb

## 2020-03-06 DIAGNOSIS — E782 Mixed hyperlipidemia: Secondary | ICD-10-CM | POA: Diagnosis not present

## 2020-03-06 DIAGNOSIS — E538 Deficiency of other specified B group vitamins: Secondary | ICD-10-CM | POA: Diagnosis not present

## 2020-03-06 DIAGNOSIS — D126 Benign neoplasm of colon, unspecified: Secondary | ICD-10-CM

## 2020-03-06 DIAGNOSIS — E559 Vitamin D deficiency, unspecified: Secondary | ICD-10-CM | POA: Diagnosis not present

## 2020-03-06 DIAGNOSIS — L0390 Cellulitis, unspecified: Secondary | ICD-10-CM

## 2020-03-06 DIAGNOSIS — R7301 Impaired fasting glucose: Secondary | ICD-10-CM

## 2020-03-06 DIAGNOSIS — Z23 Encounter for immunization: Secondary | ICD-10-CM

## 2020-03-06 DIAGNOSIS — Z0000 Encounter for general adult medical examination without abnormal findings: Secondary | ICD-10-CM

## 2020-03-06 DIAGNOSIS — N200 Calculus of kidney: Secondary | ICD-10-CM

## 2020-03-06 LAB — COMPREHENSIVE METABOLIC PANEL
ALT: 11 U/L (ref 0–35)
AST: 17 U/L (ref 0–37)
Albumin: 4.7 g/dL (ref 3.5–5.2)
Alkaline Phosphatase: 64 U/L (ref 39–117)
BUN: 26 mg/dL — ABNORMAL HIGH (ref 6–23)
CO2: 30 mEq/L (ref 19–32)
Calcium: 10 mg/dL (ref 8.4–10.5)
Chloride: 101 mEq/L (ref 96–112)
Creatinine, Ser: 0.78 mg/dL (ref 0.40–1.20)
GFR: 80.24 mL/min (ref >60.00)
Glucose, Bld: 83 mg/dL (ref 70–99)
Potassium: 4.9 mEq/L (ref 3.5–5.1)
Sodium: 140 mEq/L (ref 135–145)
Total Bilirubin: 0.7 mg/dL (ref 0.2–1.2)
Total Protein: 6.9 g/dL (ref 6.0–8.3)

## 2020-03-06 LAB — LIPID PANEL
Cholesterol: 250 mg/dL — ABNORMAL HIGH (ref 0–200)
HDL: 120.5 mg/dL (ref >39.00)
LDL Cholesterol: 116 mg/dL — ABNORMAL HIGH (ref 0–99)
NonHDL: 129.59
Total CHOL/HDL Ratio: 2
Triglycerides: 67 mg/dL (ref 0.0–149.0)
VLDL: 13.4 mg/dL (ref 0.0–40.0)

## 2020-03-06 LAB — VITAMIN D 25 HYDROXY (VIT D DEFICIENCY, FRACTURES): VITD: 63.05 ng/mL (ref 30.00–100.00)

## 2020-03-06 LAB — TSH: TSH: 2.14 u[IU]/mL (ref 0.35–4.50)

## 2020-03-06 LAB — VITAMIN B12: Vitamin B-12: >1526 pg/mL — ABNORMAL HIGH (ref 211–911)

## 2020-03-06 MED ORDER — SULFAMETHOXAZOLE-TRIMETHOPRIM 800-160 MG PO TABS: 1.0000 | ORAL_TABLET | Freq: Two times a day (BID) | ORAL | 0 refills | Status: DC

## 2020-03-06 MED ORDER — MUPIROCIN 2 % EX OINT: 1.0000 "application " | TOPICAL_OINTMENT | Freq: Two times a day (BID) | CUTANEOUS | 0 refills | Status: DC

## 2020-03-06 NOTE — Patient Instructions (Signed)
I think your skin breakouts are due to "MRSA"  Take the antibiotic for one week  Use the ointment in your nose twice daily for one week   Please take a probiotic ( Align, Floraque or Culturelle) of the generic version of one of these  For a minimum of 3 weeks to prevent a serious antibiotic associated diarrhea  Called clostridium dificile colitis    Referral for colonoscopy in process

## 2020-03-06 NOTE — Assessment & Plan Note (Signed)
Found on 2017 screening colonoscopy.  Follow up 5 yrs; now overdue

## 2020-03-06 NOTE — Progress Notes (Signed)
Patient ID: Megan Mcintosh, female    DOB: 1955/11/03  Age: 64 y.o. MRN: [id number]  The patient is here for annual preventive  examination and management of other chronic and acute problems.  This visit occurred during the SARS-CoV-2 public health emergency.  Safety protocols were in place, including screening questions prior to the visit, additional usage of staff PPE, and extensive cleaning of exam room while observing appropriate contact time as indicated for disinfecting solutions.    Patient has received both doses of the Kelley 19 vaccine without complications.  Patient continues to mask when outside of the home except when walking in yard or at safe distances from others .  Patient denies any change in mood or development of unhealthy behaviors resuting from the pandemic's restriction of activities and socialization.     The risk factors are reflected in the social history.  The roster of all physicians providing medical care to patient - is listed in the Snapshot section of the chart.  Activities of daily living:  The patient is 100% independent in all ADLs: dressing, toileting, feeding as well as independent mobility  Home safety : The patient has smoke detectors in the home. They wear seatbelts.  There are no firearms at home. There is no violence in the home.   There is no risks for hepatitis, STDs or HIV. There is no   history of blood transfusion. They have no travel history to infectious disease endemic areas of the world.  The patient has seen their dentist in the last six month. They have seen their eye doctor in the last year. They admit to slight hearing difficulty with regard to whispered voices and some television programs.  They have deferred audiologic testing in the last year.  They do not  have excessive sun exposure. Discussed the need for sun protection: hats, long sleeves and use of sunscreen if there is significant sun exposure.   Diet: the importance of a  healthy diet is discussed. They do have a healthy diet.  The benefits of regular aerobic exercise were discussed. She walks 4 times per week ,  20 minutes.   Depression screen: there are no signs or vegative symptoms of depression- irritability, change in appetite, anhedonia, sadness/tearfullness.  Cognitive assessment: the patient manages all their financial and personal affairs and is actively engaged. They could relate day,date,year and events; recalled 2/3 objects at 3 minutes; performed clock-face test normally.  The following portions of the patient's history were reviewed and updated as appropriate: allergies, current medications, past family history, past medical history,  past surgical history, past social history  and problem list.  Visual acuity was not assessed per patient preference since she has regular follow up with her ophthalmologist. Hearing and body mass index were assessed and reviewed.   During the course of the visit the patient was educated and counseled about appropriate screening and preventive services including : fall prevention , diabetes screening, nutrition counseling, colorectal cancer screening, and recommended immunizations.    CC: The primary encounter diagnosis was Moderate mixed hyperlipidemia not requiring statin therapy. Diagnoses of Tubular adenoma of colon, Vitamin D deficiency, Impaired fasting glucose, COVID-19 vaccine regimen to maintain immunity completed, B12 deficiency, Recurrent cellulitis, Visit for preventive health examination, and Nephrolithiasis were also pertinent to this visit.  History Britlee has a past medical history of Arthritis, Breast mass, right, Cervical dysplasia (1980), Fatigue, History of kidney stones (2017, 2019), Hypercholesteremia, Nephrolithiasis (1987), Osteopenia of femoral neck, Pilonidal cyst,  Pinguecula of right eye, Vitamin D deficiency (2018), and Wrist fracture (1967).   She has a past surgical history that includes Foot  surgery (2006); Tubal ligation (1990); Cesarean section; pilonidal (1979); Cystoscopy with stent placement (Right, 09/12/2015); Cystoscopy/ureteroscopy/holmium laser/stent placement (Right, 09/29/2015); Stone extraction with basket (Right, 09/29/2015); Colonoscopy (N/A, 03/26/2016); Breast surgery (JAN 2012); Foot surgery (Bilateral, 2006); Extracorporeal shock wave lithotripsy (Left, 06/16/2017); and Breast biopsy (Right, 2012).   Her family history includes Aneurysm (age of onset: 82) in her mother; Birth defects in her maternal uncle; Kidney disease in her mother; Stroke in her mother; Urolithiasis in her mother.She reports that she has never smoked. She has never used smokeless tobacco. She reports current alcohol use. She reports that she does not use drugs.  Outpatient Medications Prior to Visit  Medication Sig Dispense Refill  . Calcium Carb-Cholecalciferol (CALCIUM-VITAMIN D) 500-200 MG-UNIT tablet Take 1 tablet by mouth daily.     . cholecalciferol (VITAMIN D) 25 MCG (1000 UT) tablet Take 1,000 Units by mouth daily.    . vitamin B-12 (CYANOCOBALAMIN) 500 MCG tablet Take 500 mcg by mouth daily.     No facility-administered medications prior to visit.    Review of Systems  Objective:  BP 118/66 (BP Location: Left Arm, Patient Position: Sitting, Cuff Size: Normal)   Pulse 71   Temp (!) 97.5 F (36.4 C) (Oral)   Resp 14   Ht 5' 0.5" (1.537 m)   Wt 96 lb 6.4 oz (43.7 kg)   LMP 05/17/2011   SpO2 99%   BMI 18.52 kg/m   Physical Exam    Assessment & Plan:   Problem List Items Addressed This Visit      Unprioritized   B12 deficiency   Relevant Orders   Vitamin B12 (Completed)   RESOLVED: Impaired fasting glucose   Relevant Orders   Comprehensive metabolic panel (Completed)   Nephrolithiasis    S/p lithotripsy jan 2019 for management of progressive conglomeration of stones noted in left renal pelvis. No recent episodes with increased focus on hydration       Recurrent  cellulitis    suspcect she is having CA MRSA Trial of Septra DS and mupirocin antibiotic ointment for use in nears.       Tubular adenoma of colon    Found on 2017 screening colonoscopy.  Follow up 5 yrs; now overdue       Relevant Orders   Ambulatory referral to General Surgery   Visit for preventive health examination    age appropriate education and counseling updated, referrals for preventative services and immunizations addressed, dietary and smoking counseling addressed, most recent labs reviewed.  I have personally reviewed and have noted:  1) the patient's medical and social history 2) The pt's use of alcohol, tobacco, and illicit drugs 3) The patient's current medications and supplements 4) Functional ability including ADL's, fall risk, home safety risk, hearing and visual impairment 5) Diet and physical activities 6) Evidence for depression or mood disorder 7) The patient's height, weight, and BMI have been recorded in the chart  I have made referrals, and provided counseling and education based on review of the above      Vitamin D deficiency   Relevant Orders   VITAMIN D 25 Hydroxy (Vit-D Deficiency, Fractures) (Completed)    Other Visit Diagnoses    Moderate mixed hyperlipidemia not requiring statin therapy    -  Primary   Relevant Orders   Lipid panel (Completed)   TSH (Completed)  COVID-19 vaccine regimen to maintain immunity completed       Relevant Orders   SARS-CoV-2 Semi-Quantitative Total Antibody, Spike (Completed)      I am having Paizlee B. Fear "[place]" start on sulfamethoxazole-trimethoprim and mupirocin ointment. I am also having her maintain her calcium-vitamin D, vitamin B-12, and cholecalciferol.  Meds ordered this encounter  Medications  . sulfamethoxazole-trimethoprim (BACTRIM DS) 800-160 MG tablet    Sig: Take 1 tablet by mouth 2 (two) times daily.    Dispense:  14 tablet    Refill:  0    Patient needs   A probiotic to take for 3 weeks  too, can you pick one out for her ?  . mupirocin ointment (BACTROBAN) 2 %    Sig: Apply 1 application topically 2 (two) times daily.    Dispense:  22 g    Refill:  0    There are no discontinued medications.  Follow-up: No follow-ups on file.   [name], MD

## 2020-03-07 LAB — SARS-COV-2 SEMI-QUANTITATIVE TOTAL ANTIBODY, SPIKE
SARS-CoV-2 Semi-Quant Total Ab: 186.4 U/mL (ref <0.8)
SARS-CoV-2 Spike Ab Interp: POSITIVE

## 2020-03-08 DIAGNOSIS — L0390 Cellulitis, unspecified: Secondary | ICD-10-CM | POA: Insufficient documentation

## 2020-03-08 NOTE — Assessment & Plan Note (Signed)

## 2020-03-08 NOTE — Progress Notes (Signed)
Your CBC, thyroid ,b12 , vitamin d,   cholesterol,  liver and kidney function are excellent .      Please Plan to repeat fasting labs in 1 year.   Regards,  Dr. Derrel Nip

## 2020-03-08 NOTE — Assessment & Plan Note (Signed)
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 No recent episodes with increased focus on hydration  

## 2020-03-08 NOTE — Assessment & Plan Note (Signed)
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

## 2020-05-22 ENCOUNTER — Other Ambulatory Visit: Payer: Self-pay | Admitting: Internal Medicine

## 2020-05-22 DIAGNOSIS — Z1231 Encounter for screening mammogram for malignant neoplasm of breast: Secondary | ICD-10-CM

## 2020-07-31 ENCOUNTER — Other Ambulatory Visit: Payer: Self-pay

## 2020-07-31 ENCOUNTER — Ambulatory Visit
Admission: RE | Admit: 2020-07-31 | Discharge: 2020-07-31 | Disposition: A | Discharge status: Home / Self Care | Payer: BC Managed Care – PPO | Source: Ambulatory Visit | Attending: Internal Medicine | Admitting: Internal Medicine

## 2020-07-31 DIAGNOSIS — Z1231 Encounter for screening mammogram for malignant neoplasm of breast: Secondary | ICD-10-CM | POA: Diagnosis not present

## 2021-03-11 ENCOUNTER — Encounter: Payer: BC Managed Care – PPO | Admitting: Internal Medicine

## 2021-04-07 ENCOUNTER — Ambulatory Visit (INDEPENDENT_AMBULATORY_CARE_PROVIDER_SITE_OTHER): Payer: Medicare Other | Admitting: Internal Medicine

## 2021-04-07 ENCOUNTER — Other Ambulatory Visit: Payer: Self-pay

## 2021-04-07 ENCOUNTER — Encounter: Payer: Self-pay | Admitting: Internal Medicine

## 2021-04-07 VITALS — BP 104/62 | HR 89 | Temp 97.8°F | Ht 60.25 in | Wt 96.6 lb

## 2021-04-07 DIAGNOSIS — Z0000 Encounter for general adult medical examination without abnormal findings: Secondary | ICD-10-CM | POA: Diagnosis not present

## 2021-04-07 DIAGNOSIS — E559 Vitamin D deficiency, unspecified: Secondary | ICD-10-CM

## 2021-04-07 DIAGNOSIS — M810 Age-related osteoporosis without current pathological fracture: Secondary | ICD-10-CM

## 2021-04-07 DIAGNOSIS — R7301 Impaired fasting glucose: Secondary | ICD-10-CM | POA: Diagnosis not present

## 2021-04-07 DIAGNOSIS — D126 Benign neoplasm of colon, unspecified: Secondary | ICD-10-CM | POA: Diagnosis not present

## 2021-04-07 DIAGNOSIS — R5383 Other fatigue: Secondary | ICD-10-CM

## 2021-04-07 MED ORDER — ZOSTER VAC RECOMB ADJUVANTED 50 MCG/0.5ML IM SUSR: 0.5000 mL | Freq: Once | INTRAMUSCULAR | 1 refills | Status: AC

## 2021-04-07 NOTE — Assessment & Plan Note (Signed)

## 2021-04-07 NOTE — Patient Instructions (Signed)
I AM MAKING THE REFERRAL FOR YOUR COLONOSCOPY  I recommend that you have  The TDaP vaccine IN 2014  and the Shingles vaccine. AFTER JAN 1 (SO MEDICARE WILL PAY FOR IT!)     DEXA SCAN TO BE REPEATED IN 2023

## 2021-04-07 NOTE — Progress Notes (Addendum)
The patient is here for the Welcome to  Medicare  preventive visit   This visit occurred during the SARS-CoV-2 public health emergency.  Safety protocols were in place, including screening questions prior to the visit, additional usage of staff PPE, and extensive cleaning of exam room while observing appropriate contact time as indicated for disinfecting solutions.      has a past medical history of Arthritis, Breast mass, right, Cervical dysplasia (1980), Fatigue, History of kidney stones (2017, 2019), Hypercholesteremia, Nephrolithiasis (1987), Osteopenia of femoral neck, Pilonidal cyst, Pinguecula of right eye, Vitamin D deficiency (2018), and Wrist fracture (1967).    reports that she has never smoked. She has never used smokeless tobacco. She reports current alcohol use. She reports that she does not use drugs.   The roster of all physicians providing medical care to patient : Patient Care Team: [name], MD as PCP - General (Internal Medicine)  Activities of daily living:  The patient is 100% independent in all ADLs: dressing, toileting, feeding as well as independent mobility Fall risk was assessed by direct patient evaluation of patient's balance, gait and ability to risk from a chair and from a kneeling position. Home safety : The patient has smoke detectors in the home. They wear seatbelts.  There are no firearms at home. There is no violence in the home.  Patient has seen their eye doctor in the last year.   Visual acuity was assessed today  and was 20/20 with correction lenses. Patient denies hearing difficulty with regard to whispered voices and some television programs and has  deferred audiologic testing in the last year.   There is no risks for hepatitis, STDs or HIV. There is no   history of blood transfusion. They have no travel history to infectious disease endemic areas of the world.  The patient has seen their dentist in the last six month.  They do not  have  excessive sun exposure. Discussed the need for sun protection: hats, long sleeves and use of sunscreen if there is significant sun exposure.   Diet: the importance of a healthy diet is discussed. They do have a healthy diet.  The benefits of regular aerobic exercise were discussed. Patient walks 4 times per week ,  a minimum of 20 minutes.   Depression screen:   Depression screen [facility] 2/9 04/07/2021 02/15/2017 12/25/2015  Decreased Interest 0 0 0  Down, Depressed, Hopeless 0 0 0  PHQ - 2 Score 0 0 0  Altered sleeping - 0 -  Tired, decreased energy - 0 -  Change in appetite - 0 -  Feeling bad or failure about yourself  - 0 -  Trouble concentrating - 0 -  Moving slowly or fidgety/restless - 0 -  Suicidal thoughts - 0 -  PHQ-9 Score - 0 -  Difficult doing work/chores - Not difficult at all -       Cognitive assessment: the patient manages all their financial and personal affairs and is actively engaged. They could relate day,date,year and events; recalled 2/3 objects at 3 minutes; performed clock-face test normally.  The following portions of the patient's history were reviewed and updated as appropriate: allergies, current medications, past family history, past medical history,  past surgical history, past social history  and problem list.  During the course of the visit the patient was educated and counseled about appropriate screening and preventive services including : fall prevention , diabetes screening, nutrition counseling, colorectal cancer screening, and recommended immunizations  Immunization History  Administered Date(s) Administered   [employer] Vaccination 07/20/2019, 08/17/2019, 06/03/2020   Tdap 12/12/2012  HMLISTPATIENTFRIENDLY@ Health Maintenance Due  Topic Date Due   Zoster Vaccines- Shingrix (1 of 2) Never done    Last skin cancer screening : done every 6 months by [facility]      Vital Signs: BP 104/62 (BP Location: Left Arm, Patient  Position: Sitting, Cuff Size: Normal)   Pulse 89   Temp 97.8 F (36.6 C) (Temporal)   Ht 5' 0.25" (1.53 m)   Wt 96 lb 9.6 oz (43.8 kg)   LMP 05/17/2011   SpO2 99%   BMI 18.71 kg/m    Exam: General appearance: alert, cooperative and appears stated age Head: Normocephalic, without obvious abnormality, atraumatic Eyes: conjunctivae/corneas clear. PERRL, EOM's intact. Fundi benign. Ears: normal TM's and external ear canals both ears Nose: Nares normal. Septum midline. Mucosa normal. No drainage or sinus tenderness. Throat: lips, mucosa, and tongue normal; teeth and gums normal Neck: no adenopathy, no carotid bruit, no JVD, supple, symmetrical, trachea midline and thyroid not enlarged, symmetric, no tenderness/mass/nodules Lungs: clear to auscultation bilaterally Breasts: normal appearance, no masses or tenderness Heart: regular rate and rhythm, S1, S2 normal, no murmur, click, rub or gallop Abdomen: soft, non-tender; bowel sounds normal; no masses,  no organomegaly Extremities: extremities normal, atraumatic, no cyanosis or edema Pulses: 2+ and symmetric Skin: Skin color, texture, turgor normal. No rashes or lesions Neurologic: Alert and oriented X 3, normal strength and tone. Normal symmetric reflexes. Normal coordination and gait.     End of Life Discussion and Planning   During the course of the visit , End of Life objectives were deferred.  Patient has a living will in place  .   Review of Opioid Prescriptions    Patient does not have a current opioid prescription Patient's risk factors for opioid use disorder was reviewed  Treatment of pain using non-opioid alternatives was reviewed  and encouraged   Patient risk for potential substance abuse was assessed and addressed with counselling.    Assessment and Plan:  Tubular adenoma of colon Found on 2017 screening colonoscopy.  Follow up 5 yrs; now overdue   Visit for preventive health examination age appropriate education  and counseling updated, referrals for preventative services and immunizations addressed, dietary and smoking counseling addressed, most recent labs reviewed.  I have personally reviewed and have noted:   1) the patient's medical and social history 2) The pt's use of alcohol, tobacco, and illicit drugs 3) The patient's current medications and supplements 4) Functional ability including ADL's, fall risk, home safety risk, hearing and visual impairment 5) Diet and physical activities 6) Evidence for depression or mood disorder 7) The patient's height, weight, and BMI have been recorded in the chart   I have made referrals, and provided counseling and education based on review of the above  Osteoporosis of femur without pathological fracture She is remains adamantly opposed to treatment  Calcium and vitamin D supplementation discussed ; repeat DEXA in 2023  Encounter for general adult medical examination without abnormal findings  I have ordered and reviewed a 12 lead EKG and find that there are no acute changes and patient is in sinus rhythm.    Updated Medication List Outpatient Encounter Medications as of 04/07/2021  Medication Sig   Calcium Carb-Cholecalciferol (CALCIUM-VITAMIN D) 500-200 MG-UNIT tablet Take 1 tablet by mouth daily.    cholecalciferol (VITAMIN D) 25 MCG (1000 UT) tablet Take 1,000 Units by  mouth daily.   vitamin B-12 (CYANOCOBALAMIN) 500 MCG tablet Take 500 mcg by mouth daily.   [EXPIRED] Zoster Vaccine Adjuvanted [facility]) injection Inject 0.5 mLs into the muscle once for 1 dose.   [DISCONTINUED] mupirocin ointment (BACTROBAN) 2 % Apply 1 application topically 2 (two) times daily. (Patient not taking: Reported on 04/07/2021)   [DISCONTINUED] sulfamethoxazole-trimethoprim (BACTRIM DS) 800-160 MG tablet Take 1 tablet by mouth 2 (two) times daily. (Patient not taking: Reported on 04/07/2021)   No facility-administered encounter medications on file as of 04/07/2021.

## 2021-04-07 NOTE — Assessment & Plan Note (Signed)
Found on 2017 screening colonoscopy.  Follow up 5 yrs; now overdue

## 2021-04-07 NOTE — Assessment & Plan Note (Signed)
She is remains adamantly opposed to treatment  Calcium and vitamin D supplementation discussed ; repeat DEXA in 2023

## 2021-04-08 LAB — CBC WITH DIFFERENTIAL/PLATELET
Basophils Absolute: 0 10*3/uL (ref 0.0–0.1)
Basophils Relative: 0.6 % (ref 0.0–3.0)
Eosinophils Absolute: 0 10*3/uL (ref 0.0–0.7)
Eosinophils Relative: 0.3 % (ref 0.0–5.0)
HCT: 41 % (ref 36.0–46.0)
Hemoglobin: 13.5 g/dL (ref 12.0–15.0)
Lymphocytes Relative: 21.4 % (ref 12.0–46.0)
Lymphs Abs: 1.4 10*3/uL (ref 0.7–4.0)
MCHC: 32.9 g/dL (ref 30.0–36.0)
MCV: 90.8 fl (ref 78.0–100.0)
Monocytes Absolute: 0.4 10*3/uL (ref 0.1–1.0)
Monocytes Relative: 6.6 % (ref 3.0–12.0)
Neutro Abs: 4.7 10*3/uL (ref 1.4–7.7)
Neutrophils Relative %: 71.1 % (ref 43.0–77.0)
Platelets: 350 10*3/uL (ref 150.0–400.0)
RBC: 4.52 Mil/uL (ref 3.87–5.11)
RDW: 13.2 % (ref 11.5–15.5)
WBC: 6.6 10*3/uL (ref 4.0–10.5)

## 2021-04-08 LAB — VITAMIN D 25 HYDROXY (VIT D DEFICIENCY, FRACTURES): VITD: 57.93 ng/mL (ref 30.00–100.00)

## 2021-04-08 LAB — COMPREHENSIVE METABOLIC PANEL
ALT: 13 U/L (ref 0–35)
AST: 20 U/L (ref 0–37)
Albumin: 4.7 g/dL (ref 3.5–5.2)
Alkaline Phosphatase: 70 U/L (ref 39–117)
BUN: 21 mg/dL (ref 6–23)
CO2: 31 mEq/L (ref 19–32)
Calcium: 10 mg/dL (ref 8.4–10.5)
Chloride: 103 mEq/L (ref 96–112)
Creatinine, Ser: 0.79 mg/dL (ref 0.40–1.20)
GFR: 78.59 mL/min (ref >60.00)
Glucose, Bld: 87 mg/dL (ref 70–99)
Potassium: 5.1 mEq/L (ref 3.5–5.1)
Sodium: 141 mEq/L (ref 135–145)
Total Bilirubin: 0.6 mg/dL (ref 0.2–1.2)
Total Protein: 6.6 g/dL (ref 6.0–8.3)

## 2021-04-08 LAB — LIPID PANEL
Cholesterol: 243 mg/dL — ABNORMAL HIGH (ref 0–200)
HDL: 116.1 mg/dL (ref >39.00)
LDL Cholesterol: 114 mg/dL — ABNORMAL HIGH (ref 0–99)
NonHDL: 126.63
Total CHOL/HDL Ratio: 2
Triglycerides: 63 mg/dL (ref 0.0–149.0)
VLDL: 12.6 mg/dL (ref 0.0–40.0)

## 2021-04-08 LAB — MICROALBUMIN / CREATININE URINE RATIO
Creatinine,U: 90.2 mg/dL
Microalb Creat Ratio: 1.3 mg/g (ref 0.0–30.0)
Microalb, Ur: 1.1 mg/dL (ref 0.0–1.9)

## 2021-04-08 LAB — TSH: TSH: 1.99 u[IU]/mL (ref 0.35–5.50)

## 2021-04-08 LAB — HEMOGLOBIN A1C: Hgb A1c MFr Bld: 5.6 % (ref 4.6–6.5)

## 2021-04-15 ENCOUNTER — Other Ambulatory Visit: Payer: Self-pay

## 2021-04-15 ENCOUNTER — Telehealth: Payer: Self-pay

## 2021-04-15 MED ORDER — NA SULFATE-K SULFATE-MG SULF 17.5-3.13-1.6 GM/177ML PO SOLN: 1.0000 | ORAL | 0 refills | Status: DC

## 2021-04-15 NOTE — Telephone Encounter (Signed)
Gastroenterology Pre-Procedure Review  Request Date: 06/15/21 Requesting Physician: Dr. Marius Ditch  PATIENT REVIEW QUESTIONS: The patient responded to the following health history questions as indicated:    1. Are you having any GI issues? no 2. Do you have a personal history of Polyps? yes (2017) 3. Do you have a family history of Colon Cancer or Polyps? no 4. Diabetes Mellitus? no 5. Joint replacements in the past 12 months?no 6. Major health problems in the past 3 months?no 7. Any artificial heart valves, MVP, or defibrillator?no    MEDICATIONS & ALLERGIES:    Patient reports the following regarding taking any anticoagulation/antiplatelet therapy:   Plavix, Coumadin, Eliquis, Xarelto, Lovenox, Pradaxa, Brilinta, or Effient? no Aspirin? no  Patient confirms/reports the following medications:  Current Outpatient Medications  Medication Sig Dispense Refill   Calcium Carb-Cholecalciferol (CALCIUM-VITAMIN D) 500-200 MG-UNIT tablet Take 1 tablet by mouth daily.      cholecalciferol (VITAMIN D) 25 MCG (1000 UT) tablet Take 1,000 Units by mouth daily.     vitamin B-12 (CYANOCOBALAMIN) 500 MCG tablet Take 500 mcg by mouth daily.     No current facility-administered medications for this visit.    Patient confirms/reports the following allergies:  No Known Allergies  No orders of the defined types were placed in this encounter.   AUTHORIZATION INFORMATION Primary Insurance: 1D#: Group #:  Secondary Insurance: 1D#: Group #:  SCHEDULE INFORMATION: Date:  Time: Location:

## 2021-04-20 NOTE — Assessment & Plan Note (Signed)
I have ordered and reviewed a 12 lead EKG and find that there are no acute changes and patient is in sinus rhythm.

## 2021-05-26 ENCOUNTER — Telehealth: Payer: Self-pay | Admitting: Gastroenterology

## 2021-05-26 NOTE — Telephone Encounter (Signed)
Patient LVM wanting to reschedule procedure date. Clinical staff will follow up with patient.

## 2021-05-27 NOTE — Telephone Encounter (Signed)
Pt rescheduled to Feb 6th. Pt has paperwork and rx.

## 2021-06-22 ENCOUNTER — Other Ambulatory Visit: Payer: Self-pay | Admitting: Internal Medicine

## 2021-06-22 DIAGNOSIS — Z1231 Encounter for screening mammogram for malignant neoplasm of breast: Secondary | ICD-10-CM

## 2021-06-29 ENCOUNTER — Encounter
Admission: RE | Disposition: A | Discharge status: Home / Self Care | Payer: Self-pay | Source: Home / Self Care | Attending: Gastroenterology

## 2021-06-29 ENCOUNTER — Encounter: Payer: Self-pay | Admitting: Gastroenterology

## 2021-06-29 ENCOUNTER — Ambulatory Visit
Admission: RE | Admit: 2021-06-29 | Discharge: 2021-06-29 | Disposition: A | Discharge status: Home / Self Care | Payer: Medicare Other | Attending: Gastroenterology | Admitting: Gastroenterology

## 2021-06-29 ENCOUNTER — Ambulatory Visit: Payer: Medicare Other | Admitting: Registered Nurse

## 2021-06-29 DIAGNOSIS — Z86010 Personal history of colonic polyps: Secondary | ICD-10-CM | POA: Diagnosis not present

## 2021-06-29 DIAGNOSIS — Z87442 Personal history of urinary calculi: Secondary | ICD-10-CM | POA: Insufficient documentation

## 2021-06-29 DIAGNOSIS — Z1211 Encounter for screening for malignant neoplasm of colon: Secondary | ICD-10-CM | POA: Insufficient documentation

## 2021-06-29 DIAGNOSIS — K5730 Diverticulosis of large intestine without perforation or abscess without bleeding: Secondary | ICD-10-CM | POA: Diagnosis not present

## 2021-06-29 DIAGNOSIS — K621 Rectal polyp: Secondary | ICD-10-CM | POA: Diagnosis not present

## 2021-06-29 HISTORY — PX: COLONOSCOPY: SHX5424

## 2021-06-29 SURGERY — COLONOSCOPY
Anesthesia: General

## 2021-06-29 MED ORDER — SODIUM CHLORIDE 0.9 % IV SOLN
INTRAVENOUS | Status: DC
  Administered 2021-06-29: 1000 mL via INTRAVENOUS

## 2021-06-29 MED ORDER — PROPOFOL 500 MG/50ML IV EMUL
INTRAVENOUS | Status: DC | PRN
  Administered 2021-06-29: 140 ug/kg/min via INTRAVENOUS

## 2021-06-29 MED ORDER — PROPOFOL 10 MG/ML IV BOLUS
INTRAVENOUS | Status: DC | PRN
Start: 2021-06-29 — End: 2021-06-29
  Administered 2021-06-29: 70 mg via INTRAVENOUS

## 2021-06-29 NOTE — H&P (Signed)
Cephas Darby, MD [address]  [place]  [place], [place] [address]  Main: [phone]  Fax: [phone] Pager: [phone]  Primary Care Physician:  [name], MD Primary Gastroenterologist:  Dr. Cephas Darby  Pre-Procedure History & Physical: HPI:  Megan Mcintosh is a 66 y.o. female is here for an colonoscopy.   Past Medical History:  Diagnosis Date   Arthritis    Breast mass, right    benign   Cervical dysplasia 1980   s/p freezing   Fatigue    History of kidney stones 2017, 2019   Hypercholesteremia    Osteopenia of femoral neck    left   Pilonidal cyst    Pinguecula of right eye    removed by Amado Nash   Vitamin D deficiency 2018   Wrist fracture 1967    Past Surgical History:  Procedure Laterality Date   BREAST BIOPSY Right 2012   BENIGN BREAST TISSUE WITH STROMAL FIBROSIS AND FOCAL FIBROADENOMA   BREAST SURGERY  JAN 2012   right breast , BENIGN [name]   CESAREAN SECTION     x 2   COLONOSCOPY N/A 03/26/2016   Procedure: COLONOSCOPY;  Surgeon: Manya Silvas, MD;  Location: [facility] ENDOSCOPY;  Service: Endoscopy;  Laterality: N/A;   CYSTOSCOPY WITH STENT PLACEMENT Right 09/12/2015   Procedure: CYSTOSCOPY WITH STENT PLACEMENT;  Surgeon: Ardis Hughs, MD;  Location: [facility] ORS;  Service: Urology;  Laterality: Right;   CYSTOSCOPY/URETEROSCOPY/HOLMIUM LASER/STENT PLACEMENT Right 09/29/2015   Procedure: CYSTOSCOPY/URETEROSCOPY/HOLMIUM LASER/STENT PLACEMENT/ STENT EXCHANGE;  Surgeon: [name], MD;  Location: [facility] ORS;  Service: Urology;  Laterality: Right;   EXTRACORPOREAL SHOCK WAVE LITHOTRIPSY Left 06/16/2017   Procedure: EXTRACORPOREAL SHOCK WAVE LITHOTRIPSY (ESWL);  Surgeon: [name], MD;  Location: [facility] ORS;  Service: Urology;  Laterality: Left;   FOOT SURGERY  2006   [facility]:  silicone implants in both great toes    FOOT SURGERY Bilateral 9563   silicone implants both great toes   pilonidal  1979   cyst excised     STONE EXTRACTION WITH BASKET Right 09/29/2015   Procedure: STONE EXTRACTION WITH BASKET;  Surgeon: [name], MD;  Location: [facility] ORS;  Service: Urology;  Laterality: Right;   TUBAL LIGATION  1990    Prior to Admission medications   Medication Sig Start Date End Date Taking? Authorizing Provider  Calcium Carb-Cholecalciferol (CALCIUM-VITAMIN D) 500-200 MG-UNIT tablet Take 1 tablet by mouth daily.    Yes [provider]  cholecalciferol (VITAMIN D) 25 MCG (1000 UT) tablet Take 1,000 Units by mouth daily.   Yes [provider]  Na Sulfate-K Sulfate-Mg Sulf (SUPREP BOWEL PREP KIT) 17.5-3.13-1.6 GM/177ML SOLN Take 1 kit by mouth as directed. 04/15/21  Yes Lamees Gable, Tally Due, MD  vitamin B-12 (CYANOCOBALAMIN) 500 MCG tablet Take 500 mcg by mouth daily.   Yes [provider]    Allergies as of 04/14/2021   (No Known Allergies)    Family History  Problem Relation Age of Onset   Aneurysm Mother 39       Hemorrhage CVA   Stroke Mother    Urolithiasis Mother    Kidney disease Mother    Birth defects Maternal Uncle    Cancer Neg Hx    Prostate cancer Neg Hx    Kidney cancer Neg Hx    Breast cancer Neg Hx     Social History   Socioeconomic History   Marital status: Married  Spouse name: Not on file   Number of children: Not on file   Years of education: Not on file   Highest education level: Not on file  Occupational History   Not on file  Tobacco Use   Smoking status: Never   Smokeless tobacco: Never  Vaping Use   Vaping Use: Never used  Substance and Sexual Activity   Alcohol use: Yes    Comment: occasionally on weekends   Drug use: No   Sexual activity: Yes    Birth control/protection: Post-menopausal  Other Topics Concern   Not on file  Social History Narrative   Not on file   Social Determinants of Health   Financial Resource Strain: Not on file  Food Insecurity: Not on file  Transportation Needs: Not on file  Physical Activity:  Not on file  Stress: Not on file  Social Connections: Not on file  Intimate Partner Violence: Not on file    Review of Systems: See HPI, otherwise negative ROS  Physical Exam: BP (!) 113/52    Pulse 61    Temp (!) 96.3 F (35.7 C) (Temporal)    Resp 16    Ht 5' 0.5" (1.537 m)    Wt 44.1 kg    LMP 05/17/2011    SpO2 100%    BMI 18.67 kg/m  General:   Alert,  pleasant and cooperative in NAD Head:  Normocephalic and atraumatic. Neck:  Supple; no masses or thyromegaly. Lungs:  Clear throughout to auscultation.    Heart:  Regular rate and rhythm. Abdomen:  Soft, nontender and nondistended. Normal bowel sounds, without guarding, and without rebound.   Neurologic:  Alert and  oriented x4;  grossly normal neurologically.  Impression/Plan: Megan Mcintosh is here for an colonoscopy to be performed for h/o adenoma colon  Risks, benefits, limitations, and alternatives regarding  colonoscopy have been reviewed with the patient.  Questions have been answered.  All parties agreeable.   Sherri Sear, MD  06/29/2021, 7:34 AM

## 2021-06-29 NOTE — Transfer of Care (Signed)
Immediate Anesthesia Transfer of Care Note  Patient: Megan Mcintosh  Procedure(s) Performed: COLONOSCOPY  Patient Location: PACU  Anesthesia Type:General  Level of Consciousness: awake, alert  and oriented  Airway & Oxygen Therapy: Patient Spontanous Breathing  Post-op Assessment: Report given to RN and Post -op Vital signs reviewed and stable  Post vital signs: Reviewed and stable  Last Vitals:  Vitals Value Taken Time  BP    Temp    Pulse    Resp    SpO2      Last Pain:  Vitals:   06/29/21 0703  TempSrc: Temporal  PainSc: 0-No pain         Complications: No notable events documented.

## 2021-06-29 NOTE — Op Note (Signed)
[facility] Gastroenterology Patient Name: Megan Mcintosh Procedure Date: 06/29/2021 7:34 AM MRN: [id number] Account #: [id number] Date of Birth: 08-Jul-1955 Admit Type: Outpatient Age: 66 Room: [facility] ENDO ROOM 1 Gender: Female Note Status: Finalized Instrument Name: Peds Colonoscope [id number] Procedure:             Colonoscopy Indications:           Surveillance: Personal history of adenomatous polyps                         on last colonoscopy 5 years ago, Last colonoscopy:                         November 2017 Providers:             Lin Landsman MD, MD Referring MD:          Deborra Medina, MD (Referring MD) Medicines:             General Anesthesia Complications:         No immediate complications. Estimated blood loss: None. Procedure:             Pre-Anesthesia Assessment:                        - Prior to the procedure, a History and Physical was                         performed, and patient medications and allergies were                         reviewed. The patient is competent. The risks and                         benefits of the procedure and the sedation options and                         risks were discussed with the patient. All questions                         were answered and informed consent was obtained.                         Patient identification and proposed procedure were                         verified by the physician, the nurse, the                         anesthesiologist, the anesthetist and the technician                         in the pre-procedure area in the procedure room in the                         endoscopy suite. Mental Status Examination: alert and                         oriented. Airway Examination: normal oropharyngeal  airway and neck mobility. Respiratory Examination:                         clear to auscultation. CV Examination: normal.                         Prophylactic Antibiotics: The  patient does not require                         prophylactic antibiotics. Prior Anticoagulants: The                         patient has taken no previous anticoagulant or                         antiplatelet agents. ASA Grade Assessment: II - A                         patient with mild systemic disease. After reviewing                         the risks and benefits, the patient was deemed in                         satisfactory condition to undergo the procedure. The                         anesthesia plan was to use general anesthesia.                         Immediately prior to administration of medications,                         the patient was re-assessed for adequacy to receive                         sedatives. The heart rate, respiratory rate, oxygen                         saturations, blood pressure, adequacy of pulmonary                         ventilation, and response to care were monitored                         throughout the procedure. The physical status of the                         patient was re-assessed after the procedure.                        After obtaining informed consent, the colonoscope was                         passed under direct vision. Throughout the procedure,                         the patient's blood pressure, pulse, and oxygen  saturations were monitored continuously. The                         Colonoscope was introduced through the anus and                         advanced to the the cecum, identified by appendiceal                         orifice and ileocecal valve. The colonoscopy was                         performed without difficulty. The patient tolerated                         the procedure well. The quality of the bowel                         preparation was evaluated using the BBPS [facility] Bowel                         Preparation Scale) with scores of: Right Colon = 3,                         Transverse  Colon = 3 and Left Colon = 3 (entire mucosa                         seen well with no residual staining, small fragments                         of stool or opaque liquid). The total BBPS score                         equals 9. Findings:      The perianal and digital rectal examinations were normal. Pertinent       negatives include normal sphincter tone and no palpable rectal lesions.      Many small-mouthed diverticula were found in the recto-sigmoid colon and       sigmoid colon.      A 3 mm polyp was found in the proximal rectum. The polyp was sessile.       The polyp was removed with a cold snare. Resection and retrieval were       complete. Estimated blood loss: none.      The retroflexed view of the distal rectum and anal verge was normal and       showed no anal or rectal abnormalities. Impression:            - Diverticulosis in the recto-sigmoid colon and in the                         sigmoid colon.                        - One 3 mm polyp in the proximal rectum, removed with                         a cold snare. Resected and retrieved.                        -  The distal rectum and anal verge are normal on                         retroflexion view. Recommendation:        - Discharge patient to home (with escort).                        - Resume previous diet today.                        - Continue present medications.                        - Await pathology results.                        - Repeat colonoscopy in 7-10 years for surveillance                         based on pathology results. Procedure Code(s):     --- Professional ---                        [phone], Colonoscopy, flexible; with removal of                         tumor(s), polyp(s), or other lesion(s) by snare                         technique Diagnosis Code(s):     --- Professional ---                        K62.1, Rectal polyp                        Z86.010, Personal history of colonic polyps                         K57.30, Diverticulosis of large intestine without                         perforation or abscess without bleeding CPT copyright 2019 American Medical [employer]. All rights reserved. The codes documented in this report are preliminary and upon coder review may  be revised to meet current compliance requirements. Dr. Ulyess Mort Lin Landsman MD, MD 06/29/2021 8:05:29 AM This report has been signed electronically. Number of Addenda: 0 Note Initiated On: 06/29/2021 7:34 AM Scope Withdrawal Time: 0 hours 7 minutes 31 seconds  Total Procedure Duration: 0 hours 11 minutes 53 seconds  Estimated Blood Loss:  Estimated blood loss: none.      [facility]

## 2021-06-29 NOTE — Anesthesia Preprocedure Evaluation (Signed)
Anesthesia Evaluation  Patient identified by MRN, date of birth, ID band Patient awake    Reviewed: Allergy & Precautions, H&P , NPO status , Patient's Chart, lab work & pertinent test results, reviewed documented beta blocker date and time   History of Anesthesia Complications Negative for: history of anesthetic complications  Airway Mallampati: I  TM Distance: >3 FB Neck ROM: full    Dental  (+) Caps, Teeth Intact, Dental Advidsory Given   Pulmonary neg pulmonary ROS,    Pulmonary exam normal breath sounds clear to auscultation       Cardiovascular Exercise Tolerance: Good negative cardio ROS Normal cardiovascular exam Rhythm:regular Rate:Normal     Neuro/Psych negative neurological ROS  negative psych ROS   GI/Hepatic negative GI ROS, Neg liver ROS,   Endo/Other  negative endocrine ROS  Renal/GU Renal disease (2 proximal kidney stones)  negative genitourinary   Musculoskeletal   Abdominal   Peds  Hematology negative hematology ROS (+)   Anesthesia Other Findings Past Medical History:   Pinguecula of right eye                                        Comment:removed by Amado Nash   Nephrolithiasis                                 1987         Cervical dysplasia                              1980           Comment:s/p freezing   Reproductive/Obstetrics negative OB ROS                             Anesthesia Physical  Anesthesia Plan  ASA: 2 and emergent  Anesthesia Plan: General   Post-op Pain Management:    Induction: Intravenous  PONV Risk Score and Plan: 3 and Propofol infusion and TIVA  Airway Management Planned: Natural Airway and Nasal Cannula  Additional Equipment:   Intra-op Plan:   Post-operative Plan:   Informed Consent: I have reviewed the patients History and Physical, chart, labs and discussed the procedure including the risks, benefits and alternatives for  the proposed anesthesia with the patient or authorized representative who has indicated his/her understanding and acceptance.     Dental Advisory Given  Plan Discussed with: Anesthesiologist, CRNA and Surgeon  Anesthesia Plan Comments:         Anesthesia Quick Evaluation

## 2021-06-30 ENCOUNTER — Encounter: Payer: Self-pay | Admitting: Gastroenterology

## 2021-06-30 LAB — SURGICAL PATHOLOGY

## 2021-06-30 NOTE — Anesthesia Postprocedure Evaluation (Signed)
Anesthesia Post Note  Patient: Megan Mcintosh  Procedure(s) Performed: COLONOSCOPY  Patient location during evaluation: Endoscopy Anesthesia Type: General Level of consciousness: awake and alert Pain management: pain level controlled Vital Signs Assessment: post-procedure vital signs reviewed and stable Respiratory status: spontaneous breathing, nonlabored ventilation, respiratory function stable and patient connected to nasal cannula oxygen Cardiovascular status: blood pressure returned to baseline and stable Postop Assessment: no apparent nausea or vomiting Anesthetic complications: no   No notable events documented.   Last Vitals:  Vitals:   06/29/21 0820 06/29/21 0830  BP: (!) 98/51 (!) 105/52  Pulse: (!) 44 (!) 39  Resp: 12 13  Temp:    SpO2: 100% 95%    Last Pain:  Vitals:   06/30/21 0758  TempSrc:   PainSc: 0-No pain                 Martha Clan

## 2021-08-03 ENCOUNTER — Ambulatory Visit
Admission: RE | Admit: 2021-08-03 | Discharge: 2021-08-03 | Disposition: A | Discharge status: Home / Self Care | Payer: Medicare Other | Source: Ambulatory Visit | Attending: Internal Medicine | Admitting: Internal Medicine

## 2021-08-03 ENCOUNTER — Other Ambulatory Visit: Payer: Self-pay

## 2021-08-03 DIAGNOSIS — Z1231 Encounter for screening mammogram for malignant neoplasm of breast: Secondary | ICD-10-CM | POA: Diagnosis not present

## 2022-04-08 ENCOUNTER — Ambulatory Visit (INDEPENDENT_AMBULATORY_CARE_PROVIDER_SITE_OTHER): Payer: Medicare Other

## 2022-04-08 VITALS — Ht 60.5 in | Wt 97.0 lb

## 2022-04-08 DIAGNOSIS — Z0000 Encounter for general adult medical examination without abnormal findings: Secondary | ICD-10-CM

## 2022-04-08 NOTE — Progress Notes (Addendum)
Subjective:   Megan Mcintosh is a 66 y.o. female who presents for an Initial Medicare Annual Wellness Visit.  Review of Systems    No ROS.  Medicare Wellness Virtual Visit.  Visual/audio telehealth visit, UTA vital signs.   See social history for additional risk factors.   Cardiac Risk Factors include: advanced age (>24mn, >>43women)     Objective:    Today's Vitals   04/08/22 1520  Weight: 97 lb (44 kg)  Height: 5' 0.5" (1.537 m)   Body mass index is 18.63 kg/m.     04/08/2022    2:51 PM 06/29/2021    7:02 AM 06/24/2017    2:04 AM 03/26/2016    9:00 AM 09/29/2015    1:07 PM 09/18/2015    9:16 AM 09/12/2015    1:28 AM  Advanced Directives  Does Patient Have a Medical Advance Directive? Yes Yes No No;Yes Yes Yes No;Yes  Type of AParamedicof AWaltersLiving will   Living will HWalfordLiving will HGrannisLiving will HSt. GabrielLiving will  Does patient want to make changes to medical advance directive? No - Patient declined    No - Patient declined  No - Patient declined  Copy of HOrbisoniain Chart? No - copy requested    No - copy requested  No - copy requested  Would patient like information on creating a medical advance directive?   No - Patient declined        Current Medications (verified) Outpatient Encounter Medications as of 04/08/2022  Medication Sig   Calcium Carb-Cholecalciferol (CALCIUM-VITAMIN D) 500-200 MG-UNIT tablet Take 1 tablet by mouth daily.    cholecalciferol (VITAMIN D) 25 MCG (1000 UT) tablet Take 1,000 Units by mouth daily.   vitamin B-12 (CYANOCOBALAMIN) 500 MCG tablet Take 500 mcg by mouth daily.   No facility-administered encounter medications on file as of 04/08/2022.    Allergies (verified) Patient has no known allergies.   History: Past Medical History:  Diagnosis Date   Arthritis    Breast mass, right    benign   Cervical dysplasia 1980    s/p freezing   Fatigue    History of kidney stones 2017, 2019   Hypercholesteremia    Osteopenia of femoral neck    left   Pilonidal cyst    Pinguecula of right eye    removed by DAmado Nash  Vitamin D deficiency 2018   Wrist fracture 1967   Past Surgical History:  Procedure Laterality Date   BREAST BIOPSY Right 2012   BENIGN BREAST TISSUE WITH STROMAL FIBROSIS AND FOCAL FIBROADENOMA   BREAST SURGERY  JAN 2012   right breast , BENIGN [name]   CESAREAN SECTION     x 2   COLONOSCOPY N/A 03/26/2016   Procedure: COLONOSCOPY;  Surgeon: RManya Silvas MD;  [facility]  Service: Endoscopy;  Laterality: N/A;   COLONOSCOPY N/A 06/29/2021   Procedure: COLONOSCOPY;  Surgeon: [name];  [facility];  Service: Gastroenterology;  Laterality: N/A;   CYSTOSCOPY WITH STENT PLACEMENT Right 09/12/2015   Procedure: CYSTOSCOPY WITH STENT PLACEMENT;  Surgeon: [name];  Location: [facility];  Service: Urology;  Laterality: Right;   CYSTOSCOPY/URETEROSCOPY/HOLMIUM LASER/STENT PLACEMENT Right 09/29/2015   Procedure: CYSTOSCOPY/URETEROSCOPY/HOLMIUM LASER/STENT PLACEMENT/ STENT EXCHANGE;  Surgeon: [name];  Location: [facility];  Service: Urology;  Laterality: Right;   EXTRACORPOREAL SHOCK WAVE LITHOTRIPSY Left  06/16/2017   Procedure: EXTRACORPOREAL SHOCK WAVE LITHOTRIPSY (ESWL);  Surgeon: [name], MD;  Location: [facility];  Service: Urology;  Laterality: Left;   FOOT SURGERY  2006   [facility]:  silicone implants in both great toes    FOOT SURGERY Bilateral 8563   silicone implants both great toes   pilonidal  1979   cyst excised    STONE EXTRACTION WITH BASKET Right 09/29/2015   Procedure: STONE EXTRACTION WITH BASKET;  Surgeon: [name], MD;  Location: [facility];  Service: Urology;  Laterality: Right;   TUBAL LIGATION  1990   Family History  Problem Relation Age of Onset   Aneurysm Mother 72       Hemorrhage CVA    Stroke Mother    Urolithiasis Mother    Kidney disease Mother    Birth defects Maternal Uncle    Cancer Neg Hx    Prostate cancer Neg Hx    Kidney cancer Neg Hx    Breast cancer Neg Hx    Social History   Socioeconomic History   Marital status: Married    Spouse name: Not on file   Number of children: Not on file   Years of education: Not on file   Highest education level: Not on file  Occupational History   Not on file  Tobacco Use   Smoking status: Never   Smokeless tobacco: Never  Vaping Use   Vaping Use: Never used  Substance and Sexual Activity   Alcohol use: Yes    Comment: occasionally on weekends   Drug use: No   Sexual activity: Yes    Birth control/protection: Post-menopausal  Other Topics Concern   Not on file  Social History Narrative   Not on file   Social Determinants of Health   Financial Resource Strain: Low Risk  (04/08/2022)   Overall Financial Resource Strain (CARDIA)    Difficulty of Paying Living Expenses: Not hard at all  Food Insecurity: No Food Insecurity (04/08/2022)   Hunger Vital Sign    Worried About Running Out of Food in the Last Year: Never true    C[place] in the Last Year: Never true  Transportation Needs: No Transportation Needs (04/08/2022)   PRAPARE - Hydrologist (Medical): No    Lack of Transportation (Non-Medical): No  Physical Activity: Sufficiently Active (04/08/2022)   Exercise Vital Sign    Days of Exercise per Week: 7 days    Minutes of Exercise per Session: 150+ min  Stress: No Stress Concern Present (04/08/2022)   [place]    Feeling of Stress : Not at all  Social Connections: Unknown (04/08/2022)   Social Connection and Isolation Panel [NHANES]    Frequency of Communication with Friends and Family: More than three times a week    Frequency of Social Gatherings with Friends and Family: More than three times a  week    Attends Religious Services: Not on Advertising copywriter or Organizations: Not on file    Attends Archivist Meetings: Not on file    Marital Status: Not on file    Tobacco Counseling Counseling given: Not Answered   Clinical Intake:  Pre-visit preparation completed: Yes        Diabetes: No  How often do you need to have someone help you when you read instructions, pamphlets, or other written materials from your doctor  or pharmacy?: 1 - Never    Interpreter Needed?: No      Activities of Daily Living    04/08/2022    2:53 PM  In your present state of health, do you have any difficulty performing the following activities:  Hearing? 0  Vision? 0  Difficulty concentrating or making decisions? 0  Walking or climbing stairs? 0  Dressing or bathing? 0  Doing errands, shopping? 0  Preparing Food and eating ? N  Using the Toilet? N  In the past six months, have you accidently leaked urine? N  Do you have problems with loss of bowel control? N  Managing your Medications? N  Managing your Finances? N  Housekeeping or managing your Housekeeping? N    Patient Care Team: [name], MD as PCP - General (Internal Medicine)  Indicate any recent Medical Services you may have received from other than [facility] providers in the past year (date may be approximate).     Assessment:   This is a routine wellness examination for Marketa.  I connected with  KAIDYN HERNANDES on 04/08/22 by a audio enabled telemedicine application and verified that I am speaking with the correct person using two identifiers.  Patient Location: Home  Provider Location: Office/Clinic  I discussed the limitations of evaluation and management by telemedicine. The patient expressed understanding and agreed to proceed.   Hearing/Vision screen Hearing Screening - Comments:: Patient is able to hear conversational tones without difficulty.  No issues reported.   Vision  Screening - Comments:: Followed by [facility] corrective lenses They have seen their ophthalmologist in the last 12 months.    Dietary issues and exercise activities discussed: Current Exercise Habits: Home exercise routine, Type of exercise: walking (tennis, pickleball), Time (Minutes): > 60, Frequency (Times/Week): 7, Weekly Exercise (Minutes/Week): 0, Intensity: Mild Healthy diet Good water intake   Goals Addressed             This Visit's Progress    Maintain healthy lifestyle       Stay active Healthy diet       Depression Screen    04/08/2022    2:53 PM 04/07/2021    2:45 PM 02/15/2017    1:40 PM 12/25/2015    9:37 AM  PHQ 2/9 Scores  PHQ - 2 Score 0 0 0 0  PHQ- 9 Score   0     Fall Risk    04/08/2022    2:55 PM 04/07/2021    2:45 PM 03/06/2020   10:33 AM 02/22/2019    8:39 AM  [place] in the past year? 0 0 0 0  Number falls in past yr: 0     Injury with Fall? 0     Risk for fall due to :  No Fall Risks    Follow up Falls evaluation completed Falls evaluation completed Falls evaluation completed Falls evaluation completed    [place]: Home free of loose throw rugs in walkways, pet beds, electrical cords, etc? Yes  Adequate lighting in your home to reduce risk of falls? Yes   ASSISTIVE DEVICES UTILIZED TO PREVENT FALLS: Life alert? No  Use of a cane, walker or w/c? No   TIMED UP AND GO: Was the test performed? No .    Cognitive Function: Patient is alert and oriented x3.  Enjoys brain health stimulating activities like playing the bridge cad game.  Manages her own medications and finances.  Denies difficulty focusing, making decisions, memory loss.  100% independent.       04/07/2021    2:59 PM  MMSE - Mini Mental State Exam  Orientation to time 5  Orientation to Place 5  Registration 3  Attention/ Calculation 5  Recall 3  Language- name 2 objects 2  Language- repeat 1  Language-  follow 3 step command 3  Language- read & follow direction 1  Write a sentence 1  Copy design 1  Total score 30        04/08/2022    3:02 PM  6CIT Screen  Months in reverse 0 points    Immunizations Immunization History  Administered Date(s) Administered   Moderna Sars-Covid-2 Vaccination 07/20/2019, 08/17/2019, 06/03/2020   Tdap 12/12/2012   Zoster Recombinat (Shingrix) 02/09/2022   Flu Vaccine status: Due, Education has been provided regarding the importance of this vaccine. Advised may receive this vaccine at local pharmacy or Health Dept. Aware to provide a copy of the vaccination record if obtained from local pharmacy or Health Dept. Verbalized acceptance and understanding.  Pneumococcal vaccine status: Due, Education has been provided regarding the importance of this vaccine. Advised may receive this vaccine at local pharmacy or Health Dept. Aware to provide a copy of the vaccination record if obtained from local pharmacy or Health Dept. Verbalized acceptance and understanding. Prefers to discuss with PCP at upcoming appointment. Appointment note edited to reflect.   Covid-19 vaccine status: Completed vaccines x3.   Qualifies for Shingles Vaccine? Yes   Zostavax completed No . Agrees to update immunization record once series is completed.    Screening Tests Health Maintenance  Topic Date Due   Zoster Vaccines- Shingrix (2 of 2) 04/06/2022   COVID-19 Vaccine (4 - Moderna risk series) 04/24/2022 (Originally 07/29/2020)   INFLUENZA VACCINE  08/22/2022 (Originally 12/22/2021)   Pneumonia Vaccine 60+ Years old (1 - PCV) 04/09/2023 (Originally 01/06/2021)   MAMMOGRAM  08/04/2022   TETANUS/TDAP  12/13/2022   Medicare Annual Wellness (AWV)  04/09/2023   COLONOSCOPY (Pts 45-86yr Insurance coverage will need to be confirmed)  06/30/2031   DEXA SCAN  Completed   Hepatitis C Screening  Completed   HPV VACCINES  Aged Out    Health Maintenance Health Maintenance Due  Topic Date  Due   Zoster Vaccines- Shingrix (2 of 2) 04/06/2022   Lung Cancer Screening: (Low Dose CT Chest recommended if Age 66-80years, 30 pack-year currently smoking OR have quit w/in 15years.) does not qualify.   Hepatitis C Screening: Completed 2016.  Vision Screening: Recommended annual ophthalmology exams for early detection of glaucoma and other disorders of the eye.  Dental Screening: Recommended annual dental exams for proper oral hygiene.  Community Resource Referral / Chronic Care Management: CRR required this visit?  No   CCM required this visit?  No      Plan:     I have personally reviewed and noted the following in the patient's chart:   Medical and social history Use of alcohol, tobacco or illicit drugs  Current medications and supplements including opioid prescriptions. Patient is not currently taking opioid prescriptions. Functional ability and status Nutritional status Physical activity Advanced directives List of other physicians Hospitalizations, surgeries, and ER visits in previous 12 months Vitals Screenings to include cognitive, depression, and falls Referrals and appointments  In addition, I have reviewed and discussed with patient certain preventive protocols, quality metrics, and best practice recommendations. A written personalized care plan for preventive services as well as  general preventive health recommendations were provided to patient.     [place], LPN   70/14/1030       I have reviewed the above information and agree with above.   Deborra Medina, MD

## 2022-04-08 NOTE — Patient Instructions (Addendum)
Megan Mcintosh , Thank you for taking time to come for your Medicare Wellness Visit. I appreciate your ongoing commitment to your health goals. Please review the following plan we discussed and let me know if I can assist you in the future.   These are the goals we discussed:  Goals      Maintain healthy lifestyle     Stay active Healthy diet        This is a list of the screening recommended for you and due dates:  Health Maintenance  Topic Date Due   Zoster (Shingles) Vaccine (2 of 2) 04/06/2022   COVID-19 Vaccine (4 - Moderna risk series) 04/24/2022*   Flu Shot  08/22/2022*   Pneumonia Vaccine (1 - PCV) 04/09/2023*   Mammogram  08/04/2022   Tetanus Vaccine  12/13/2022   Medicare Annual Wellness Visit  04/09/2023   Colon Cancer Screening  06/30/2031   DEXA scan (bone density measurement)  Completed   Hepatitis C Screening: USPSTF Recommendation to screen - Ages 33-79 yo.  Completed   HPV Vaccine  Aged Out  *Topic was postponed. The date shown is not the original due date.    Advanced directives: End of life planning; Advance aging; Advanced directives discussed.  Copy of current HCPOA/Living Will requested.    Conditions/risks identified: none new  Next appointment: Follow up in one year for your annual wellness visit    Preventive Care 65 Years and Older, Female Preventive care refers to lifestyle choices and visits with your health care provider that can promote health and wellness. What does preventive care include? A yearly physical exam. This is also called an annual well check. Dental exams once or twice a year. Routine eye exams. Ask your health care provider how often you should have your eyes checked. Personal lifestyle choices, including: Daily care of your teeth and gums. Regular physical activity. Eating a healthy diet. Avoiding tobacco and drug use. Limiting alcohol use. Practicing safe sex. Taking low-dose aspirin every day. Taking vitamin and mineral  supplements as recommended by your health care provider. What happens during an annual well check? The services and screenings done by your health care provider during your annual well check will depend on your age, overall health, lifestyle risk factors, and family history of disease. Counseling  Your health care provider may ask you questions about your: Alcohol use. Tobacco use. Drug use. Emotional well-being. Home and relationship well-being. Sexual activity. Eating habits. History of falls. Memory and ability to understand (cognition). Work and work Statistician. Reproductive health. Screening  You may have the following tests or measurements: Height, weight, and BMI. Blood pressure. Lipid and cholesterol levels. These may be checked every 5 years, or more frequently if you are over 92 years old. Skin check. Lung cancer screening. You may have this screening every year starting at age 74 if you have a 30-pack-year history of smoking and currently smoke or have quit within the past 15 years. Fecal occult blood test (FOBT) of the stool. You may have this test every year starting at age 77. Flexible sigmoidoscopy or colonoscopy. You may have a sigmoidoscopy every 5 years or a colonoscopy every 10 years starting at age 58. Hepatitis C blood test. Hepatitis B blood test. Sexually transmitted disease (STD) testing. Diabetes screening. This is done by checking your blood sugar (glucose) after you have not eaten for a while (fasting). You may have this done every 1-3 years. Bone density scan. This is done to screen for osteoporosis.  You may have this done starting at age 43. Mammogram. This may be done every 1-2 years. Talk to your health care provider about how often you should have regular mammograms. Talk with your health care provider about your test results, treatment options, and if necessary, the need for more tests. Vaccines  Your health care provider may recommend certain  vaccines, such as: Influenza vaccine. This is recommended every year. Tetanus, diphtheria, and acellular pertussis (Tdap, Td) vaccine. You may need a Td booster every 10 years. Zoster vaccine. You may need this after age 71. Pneumococcal 13-valent conjugate (PCV13) vaccine. One dose is recommended after age 37. Pneumococcal polysaccharide (PPSV23) vaccine. One dose is recommended after age 85. Talk to your health care provider about which screenings and vaccines you need and how often you need them. This information is not intended to replace advice given to you by your health care provider. Make sure you discuss any questions you have with your health care provider. Document Released: 06/06/2015 Document Revised: 01/28/2016 Document Reviewed: 03/11/2015 Elsevier Interactive Patient Education  2017 [place] Prevention in the Home Falls can cause injuries. They can happen to people of all ages. There are many things you can do to make your home safe and to help prevent falls. What can I do on the outside of my home? Regularly fix the edges of walkways and driveways and fix any cracks. Remove anything that might make you trip as you walk through a door, such as a raised step or threshold. Trim any bushes or trees on the path to your home. Use bright outdoor lighting. Clear any walking paths of anything that might make someone trip, such as rocks or tools. Regularly check to see if handrails are loose or broken. Make sure that both sides of any steps have handrails. Any raised decks and porches should have guardrails on the edges. Have any leaves, snow, or ice cleared regularly. Use sand or salt on walking paths during winter. Clean up any spills in your garage right away. This includes oil or grease spills. What can I do in the bathroom? Use night lights. Install grab bars by the toilet and in the tub and shower. Do not use towel bars as grab bars. Use non-skid mats or decals in  the tub or shower. If you need to sit down in the shower, use a plastic, non-slip stool. Keep the floor dry. Clean up any water that spills on the floor as soon as it happens. Remove soap buildup in the tub or shower regularly. Attach bath mats securely with double-sided non-slip rug tape. Do not have throw rugs and other things on the floor that can make you trip. What can I do in the bedroom? Use night lights. Make sure that you have a light by your bed that is easy to reach. Do not use any sheets or blankets that are too big for your bed. They should not hang down onto the floor. Have a firm chair that has side arms. You can use this for support while you get dressed. Do not have throw rugs and other things on the floor that can make you trip. What can I do in the kitchen? Clean up any spills right away. Avoid walking on wet floors. Keep items that you use a lot in easy-to-reach places. If you need to reach something above you, use a strong step stool that has a grab bar. Keep electrical cords out of the way. Do not use  floor polish or wax that makes floors slippery. If you must use wax, use non-skid floor wax. Do not have throw rugs and other things on the floor that can make you trip. What can I do with my stairs? Do not leave any items on the stairs. Make sure that there are handrails on both sides of the stairs and use them. Fix handrails that are broken or loose. Make sure that handrails are as long as the stairways. Check any carpeting to make sure that it is firmly attached to the stairs. Fix any carpet that is loose or worn. Avoid having throw rugs at the top or bottom of the stairs. If you do have throw rugs, attach them to the floor with carpet tape. Make sure that you have a light switch at the top of the stairs and the bottom of the stairs. If you do not have them, ask someone to add them for you. What else can I do to help prevent falls? Wear shoes that: Do not have high  heels. Have rubber bottoms. Are comfortable and fit you well. Are closed at the toe. Do not wear sandals. If you use a stepladder: Make sure that it is fully opened. Do not climb a closed stepladder. Make sure that both sides of the stepladder are locked into place. Ask someone to hold it for you, if possible. Clearly mark and make sure that you can see: Any grab bars or handrails. First and last steps. Where the edge of each step is. Use tools that help you move around (mobility aids) if they are needed. These include: Canes. Walkers. Scooters. Crutches. Turn on the lights when you go into a dark area. Replace any light bulbs as soon as they burn out. Set up your furniture so you have a clear path. Avoid moving your furniture around. If any of your floors are uneven, fix them. If there are any pets around you, be aware of where they are. Review your medicines with your doctor. Some medicines can make you feel dizzy. This can increase your chance of falling. Ask your doctor what other things that you can do to help prevent falls. This information is not intended to replace advice given to you by your health care provider. Make sure you discuss any questions you have with your health care provider. Document Released: 03/06/2009 Document Revised: 10/16/2015 Document Reviewed: 06/14/2014 Elsevier Interactive Patient Education  2017 [employer].

## 2022-04-09 ENCOUNTER — Encounter: Payer: Self-pay | Admitting: Internal Medicine

## 2022-04-09 ENCOUNTER — Ambulatory Visit (INDEPENDENT_AMBULATORY_CARE_PROVIDER_SITE_OTHER): Payer: Medicare Other | Admitting: Internal Medicine

## 2022-04-09 ENCOUNTER — Ambulatory Visit (INDEPENDENT_AMBULATORY_CARE_PROVIDER_SITE_OTHER): Payer: Medicare Other

## 2022-04-09 VITALS — BP 120/62 | HR 54 | Temp 98.1°F | Ht 60.5 in | Wt 96.4 lb

## 2022-04-09 DIAGNOSIS — E538 Deficiency of other specified B group vitamins: Secondary | ICD-10-CM

## 2022-04-09 DIAGNOSIS — R7301 Impaired fasting glucose: Secondary | ICD-10-CM

## 2022-04-09 DIAGNOSIS — M810 Age-related osteoporosis without current pathological fracture: Secondary | ICD-10-CM

## 2022-04-09 DIAGNOSIS — G8929 Other chronic pain: Secondary | ICD-10-CM

## 2022-04-09 DIAGNOSIS — E559 Vitamin D deficiency, unspecified: Secondary | ICD-10-CM | POA: Diagnosis not present

## 2022-04-09 DIAGNOSIS — M545 Low back pain, unspecified: Secondary | ICD-10-CM

## 2022-04-09 DIAGNOSIS — E782 Mixed hyperlipidemia: Secondary | ICD-10-CM

## 2022-04-09 DIAGNOSIS — D242 Benign neoplasm of left breast: Secondary | ICD-10-CM

## 2022-04-09 DIAGNOSIS — D75839 Thrombocytosis, unspecified: Secondary | ICD-10-CM

## 2022-04-09 DIAGNOSIS — Z86010 Personal history of colonic polyps: Secondary | ICD-10-CM | POA: Diagnosis not present

## 2022-04-09 DIAGNOSIS — R5383 Other fatigue: Secondary | ICD-10-CM | POA: Diagnosis not present

## 2022-04-09 DIAGNOSIS — N200 Calculus of kidney: Secondary | ICD-10-CM

## 2022-04-09 LAB — CBC WITH DIFFERENTIAL/PLATELET
Basophils Absolute: 0 10*3/uL (ref 0.0–0.1)
Basophils Relative: 0.3 % (ref 0.0–3.0)
Eosinophils Absolute: 0.1 10*3/uL (ref 0.0–0.7)
Eosinophils Relative: 1.1 % (ref 0.0–5.0)
HCT: 41.4 % (ref 36.0–46.0)
Hemoglobin: 14 g/dL (ref 12.0–15.0)
Lymphocytes Relative: 28.2 % (ref 12.0–46.0)
Lymphs Abs: 1.7 10*3/uL (ref 0.7–4.0)
MCHC: 33.8 g/dL (ref 30.0–36.0)
MCV: 91 fl (ref 78.0–100.0)
Monocytes Absolute: 0.3 10*3/uL (ref 0.1–1.0)
Monocytes Relative: 5.6 % (ref 3.0–12.0)
Neutro Abs: 3.9 10*3/uL (ref 1.4–7.7)
Neutrophils Relative %: 64.8 % (ref 43.0–77.0)
Platelets: 435 10*3/uL — ABNORMAL HIGH (ref 150.0–400.0)
RBC: 4.55 Mil/uL (ref 3.87–5.11)
RDW: 13.1 % (ref 11.5–15.5)
WBC: 6.1 10*3/uL (ref 4.0–10.5)

## 2022-04-09 LAB — COMPREHENSIVE METABOLIC PANEL
ALT: 19 U/L (ref 0–35)
AST: 23 U/L (ref 0–37)
Albumin: 4.8 g/dL (ref 3.5–5.2)
Alkaline Phosphatase: 76 U/L (ref 39–117)
BUN: 20 mg/dL (ref 6–23)
CO2: 28 mEq/L (ref 19–32)
Calcium: 9.5 mg/dL (ref 8.4–10.5)
Chloride: 102 mEq/L (ref 96–112)
Creatinine, Ser: 0.73 mg/dL (ref 0.40–1.20)
GFR: 85.8 mL/min (ref >60.00)
Glucose, Bld: 81 mg/dL (ref 70–99)
Potassium: 4.1 mEq/L (ref 3.5–5.1)
Sodium: 140 mEq/L (ref 135–145)
Total Bilirubin: 0.6 mg/dL (ref 0.2–1.2)
Total Protein: 7 g/dL (ref 6.0–8.3)

## 2022-04-09 LAB — LIPID PANEL
Cholesterol: 250 mg/dL — ABNORMAL HIGH (ref 0–200)
HDL: 110.6 mg/dL (ref >39.00)
LDL Cholesterol: 125 mg/dL — ABNORMAL HIGH (ref 0–99)
NonHDL: 139.19
Total CHOL/HDL Ratio: 2
Triglycerides: 72 mg/dL (ref 0.0–149.0)
VLDL: 14.4 mg/dL (ref 0.0–40.0)

## 2022-04-09 LAB — TSH: TSH: 1.98 u[IU]/mL (ref 0.35–5.50)

## 2022-04-09 LAB — VITAMIN D 25 HYDROXY (VIT D DEFICIENCY, FRACTURES): VITD: 82.05 ng/mL (ref 30.00–100.00)

## 2022-04-09 LAB — LDL CHOLESTEROL, DIRECT: Direct LDL: 122 mg/dL

## 2022-04-09 NOTE — Progress Notes (Unsigned)
Patient ID: Megan Mcintosh, female    DOB: [birth year]  Age: 66 y.o. MRN: [id number]  The patient is here for follow up and management of other chronic and acute problems.   The risk factors are reflected in the social history.  The roster of all physicians providing medical care to patient - is listed in the Snapshot section of the chart.  Activities of daily living:  The patient is 100% independent in all ADLs: dressing, toileting, feeding as well as independent mobility  Home safety : The patient has smoke detectors in the home. They wear seatbelts.  There are no firearms at home. There is no violence in the home.   There is no risks for hepatitis, STDs or HIV. There is no   history of blood transfusion. They have no travel history to infectious disease endemic areas of the world.  The patient has seen their dentist in the last six month. They have seen their eye doctor in the last year. They admit to slight hearing difficulty with regard to whispered voices and some television programs.  They have deferred audiologic testing in the last year.  They do not  have excessive sun exposure. Discussed the need for sun protection: hats, long sleeves and use of sunscreen if there is significant sun exposure.   Diet: the importance of a healthy diet is discussed. They do have a healthy diet.  The benefits of regular aerobic exercise were discussed. She  plays tennis,  pickle ball and walks 5 miles daily at a brisk pace    Depression screen: there are no signs or vegative symptoms of depression- irritability, change in appetite, anhedonia, sadness/tearfullness.  Cognitive assessment: the patient manages all their financial and personal affairs and is actively engaged. They could relate day,date,year and events; recalled 2/3 objects at 3 minutes; performed clock-face test normally.  The following portions of the patient's history were reviewed and updated as appropriate: allergies, current medications,  past family history, past medical history,  past surgical history, past social history  and problem list.  Visual acuity was not assessed per patient preference since she has regular follow up with her ophthalmologist. Hearing and body mass index were assessed and reviewed.   During the course of the visit the patient was educated and counseled about appropriate screening and preventive services including : fall prevention , diabetes screening, nutrition counseling, colorectal cancer screening, and recommended immunizations.    She has deferred influenza COVID and RSV,  and will return for Prevnar 20 in January   CC: The primary encounter diagnosis was Impaired fasting glucose. Diagnoses of Other fatigue, Mixed hyperlipidemia, H/O adenomatous polyp of colon, Fibroadenoma of left breast, Osteoporosis of femur without pathological fracture, Vitamin D deficiency, Chronic midline low back pain without sciatica, Calculus of kidney, B12 deficiency, and Nephrolithiasis were also pertinent to this visit.  History Megan Mcintosh has a past medical history of Arthritis, Breast mass, right, Cervical dysplasia (1980), Fatigue, History of kidney stones (2017, 2019), Hypercholesteremia, Osteopenia of femoral neck, Pilonidal cyst, Pinguecula of right eye, Vitamin D deficiency (2018), and Wrist fracture (1967).   She has a past surgical history that includes Foot surgery (2006); Tubal ligation (1990); Cesarean section; pilonidal (1979); Cystoscopy with stent placement (Right, 09/12/2015); Cystoscopy/ureteroscopy/holmium laser/stent placement (Right, 09/29/2015); Stone extraction with basket (Right, 09/29/2015); Colonoscopy (N/A, 03/26/2016); Breast surgery (JAN 2012); Foot surgery (Bilateral, 2006); Extracorporeal shock wave lithotripsy (Left, 06/16/2017); Breast biopsy (Right, 2012); and Colonoscopy (N/A, 06/29/2021).   Her family history includes Aneurysm (  age of onset: 79) in her mother; Birth defects in her maternal uncle; Kidney  disease in her mother; Stroke in her mother; Urolithiasis in her mother.She reports that she has never smoked. She has never used smokeless tobacco. She reports current alcohol use. She reports that she does not use drugs.  Outpatient Medications Prior to Visit  Medication Sig Dispense Refill   Calcium Carb-Cholecalciferol (CALCIUM-VITAMIN D) 500-200 MG-UNIT tablet Take 1 tablet by mouth daily.      cholecalciferol (VITAMIN D) 25 MCG (1000 UT) tablet Take 1,000 Units by mouth daily.     vitamin B-12 (CYANOCOBALAMIN) 500 MCG tablet Take 500 mcg by mouth daily.     No facility-administered medications prior to visit.    Review of Systems  Patient denies headache, fevers, malaise, unintentional weight loss, skin rash, eye pain, sinus congestion and sinus pain, sore throat, dysphagia,  hemoptysis , cough, dyspnea, wheezing, chest pain, palpitations, orthopnea, edema, abdominal pain, nausea, melena, diarrhea, constipation, flank pain, dysuria, hematuria, urinary  Frequency, nocturia, numbness, tingling, seizures,  Focal weakness, Loss of consciousness,  Tremor, insomnia, depression, anxiety, and suicidal ideation.    Objective:  BP 120/62 (BP Location: Left Arm, Patient Position: Sitting, Cuff Size: Normal)   Pulse (!) 54   Temp 98.1 F (36.7 C) (Oral)   Ht 5' 0.5" (1.537 m)   Wt 96 lb 6.4 oz (43.7 kg)   LMP 05/17/2011   SpO2 96%   BMI 18.52 kg/m   Physical Exam  General appearance: alert, cooperative and appears stated age Head: Normocephalic, without obvious abnormality, atraumatic Eyes: conjunctivae/corneas clear. PERRL, EOM's intact. Fundi benign. Ears: normal TM's and external ear canals both ears Nose: Nares normal. Septum midline. Mucosa normal. No drainage or sinus tenderness. Throat: lips, mucosa, and tongue normal; teeth and gums normal Neck: no adenopathy, no carotid bruit, no JVD, supple, symmetrical, trachea midline and thyroid not enlarged, symmetric, no  tenderness/mass/nodules Lungs: clear to auscultation bilaterally Breasts: normal appearance, no masses or tenderness Heart: regular rate and rhythm, S1, S2 normal, no murmur, click, rub or gallop Abdomen: soft, non-tender; bowel sounds normal; no masses,  no organomegaly Extremities: extremities normal, atraumatic, no cyanosis or edema Pulses: 2+ and symmetric Skin: Skin color, texture, indicative of excessive sun damage  No rashes or lesions Neurologic: Alert and oriented X 3, normal strength and tone. Normal symmetric reflexes. Normal coordination and gait.    Assessment & Plan:   Problem List Items Addressed This Visit     B12 deficiency   Relevant Orders   B12 and Folate Panel   RESOLVED: Calculus of kidney    Reviewed need to continue calcium supplementation       Chronic midline low back pain without sciatica    Brought on by house cleaning,  extended bending.  No prior films.    Tscores normal, likely inflated by bone spurs.  Getting lumbar films       Relevant Orders   DG Lumbar Spine Complete (Completed)   Fibroadenoma of breast    History of Right breast biopsy which was benign .  Annual mammogram is due in [place] 2024      H/O adenomatous polyp of colon    Reviewed colonoscopy report from Feb 2023.  One hyperplastic polyp removed ,  10 yr follow up advised      Nephrolithiasis    S/p lithotripsy jan 2019 for management of progressive conglomeration of stones noted in left renal pelvis. No recent episodes with increased focus on  hydration       Osteoporosis of femur without pathological fracture    She has been historically  opposed to treatment , has no history of fractures  and is physically extremely active.  Calcium and vitamin D supplementation reviewed as well as  the risks and benefits of various treatment options.   repeat DEXA has been ordered and if t scores have worsened she will agree to therapy with Prolia        Relevant Orders   DG Bone Density    Vitamin D deficiency   Relevant Orders   VITAMIN D 25 Hydroxy (Vit-D Deficiency, Fractures) (Completed)   Other Visit Diagnoses     Impaired fasting glucose    -  Primary   Relevant Orders   Comp Met (CMET) (Completed)   Other fatigue       Relevant Orders   CBC with Differential/Platelet (Completed)   TSH (Completed)   Mixed hyperlipidemia       Relevant Orders   Lipid Profile (Completed)   Direct LDL (Completed)       I am having Megan Mcintosh "SUSIE" maintain her calcium-vitamin D, cyanocobalamin, and cholecalciferol.    I provided 40 minutes of  face-to-face time during this encounter reviewing patient's current problems and past surgeries,  recent labs and imaging studies, providing counseling on the above mentioned problems , and coordination  of care .   Follow-up: Return in about 1 year (around 04/10/2023).   [name], MD

## 2022-04-09 NOTE — Assessment & Plan Note (Signed)
Brought on by house cleaning,  extended bending.  No prior films.    Tscores normal, likely inflated by bone spurs.  Getting lumbar films

## 2022-04-09 NOTE — Assessment & Plan Note (Addendum)
Reviewed colonoscopy report from Feb 2023.  One hyperplastic polyp removed ,  10 yr follow up advised

## 2022-04-09 NOTE — Assessment & Plan Note (Signed)
Reviewed need to continue calcium supplementation

## 2022-04-09 NOTE — Patient Instructions (Signed)
  I recommend the Prevnar 20 pneumonia vaccine sometime this winter   Your bone density test  been ordered and will be scheduled for you .  Ifg you do not receive a call  by Dec 27, let me know,  or you can call [facility] to make your appointment at  [phone]     Continue calcium and vitamin D3 supplementation

## 2022-04-09 NOTE — Assessment & Plan Note (Addendum)
She is  no longer adamantly opposed to treatment  .  Calcium and vitamin D supplementation reviewed as well as treatment options.   repeat DEXA has been ordered and if t scores have worsened she will agree to therapy with Prolia

## 2022-04-09 NOTE — Assessment & Plan Note (Addendum)
History of Right breast biopsy which was benign .  Annual mammogram is due in [place] 2024

## 2022-04-11 NOTE — Assessment & Plan Note (Signed)
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 No recent episodes with increased focus on hydration

## 2022-06-17 ENCOUNTER — Ambulatory Visit
Admission: RE | Admit: 2022-06-17 | Discharge: 2022-06-17 | Disposition: A | Discharge status: Home / Self Care | Payer: Medicare Other | Source: Ambulatory Visit | Attending: Internal Medicine | Admitting: Internal Medicine

## 2022-06-17 DIAGNOSIS — M810 Age-related osteoporosis without current pathological fracture: Secondary | ICD-10-CM

## 2022-06-19 NOTE — Assessment & Plan Note (Signed)
T scores have improved to ostepenia range without therapy basedon comparison of 2024 T scores with prior

## 2022-06-21 ENCOUNTER — Other Ambulatory Visit: Payer: Self-pay | Admitting: Internal Medicine

## 2022-06-21 DIAGNOSIS — Z1231 Encounter for screening mammogram for malignant neoplasm of breast: Secondary | ICD-10-CM

## 2022-07-06 ENCOUNTER — Encounter: Payer: Self-pay | Admitting: Internal Medicine

## 2022-07-06 NOTE — Telephone Encounter (Signed)
Pt scheduled for rpt cbc

## 2022-07-07 ENCOUNTER — Other Ambulatory Visit (INDEPENDENT_AMBULATORY_CARE_PROVIDER_SITE_OTHER): Payer: Medicare Other

## 2022-07-07 DIAGNOSIS — D75839 Thrombocytosis, unspecified: Secondary | ICD-10-CM

## 2022-07-07 LAB — CBC WITH DIFFERENTIAL/PLATELET
Basophils Absolute: 0 10*3/uL (ref 0.0–0.1)
Basophils Relative: 0.7 % (ref 0.0–3.0)
Eosinophils Absolute: 0.1 10*3/uL (ref 0.0–0.7)
Eosinophils Relative: 1.3 % (ref 0.0–5.0)
HCT: 43.1 % (ref 36.0–46.0)
Hemoglobin: 14.6 g/dL (ref 12.0–15.0)
Lymphocytes Relative: 33.5 % (ref 12.0–46.0)
Lymphs Abs: 1.7 10*3/uL (ref 0.7–4.0)
MCHC: 33.9 g/dL (ref 30.0–36.0)
MCV: 90.2 fl (ref 78.0–100.0)
Monocytes Absolute: 0.4 10*3/uL (ref 0.1–1.0)
Monocytes Relative: 7.9 % (ref 3.0–12.0)
Neutro Abs: 2.9 10*3/uL (ref 1.4–7.7)
Neutrophils Relative %: 56.6 % (ref 43.0–77.0)
Platelets: 388 10*3/uL (ref 150.0–400.0)
RBC: 4.78 Mil/uL (ref 3.87–5.11)
RDW: 13.5 % (ref 11.5–15.5)
WBC: 5.1 10*3/uL (ref 4.0–10.5)

## 2022-08-05 ENCOUNTER — Ambulatory Visit
Admission: RE | Admit: 2022-08-05 | Discharge: 2022-08-05 | Disposition: A | Discharge status: Home / Self Care | Payer: Medicare Other | Source: Ambulatory Visit | Attending: Internal Medicine | Admitting: Internal Medicine

## 2022-08-05 DIAGNOSIS — Z1231 Encounter for screening mammogram for malignant neoplasm of breast: Secondary | ICD-10-CM | POA: Insufficient documentation

## 2022-11-12 IMAGING — MG MM DIGITAL SCREENING BILAT W/ TOMO AND CAD
6 of 12 series · 6 of 36 positions shown · non-contrast
Comparison: Previous exam(s).

CLINICAL DATA: Screening.

EXAM:
DIGITAL SCREENING BILATERAL MAMMOGRAM WITH TOMOSYNTHESIS AND CAD
TECHNIQUE: Bilateral screening digital craniocaudal and mediolateral oblique
mammograms were obtained. Bilateral screening digital breast
tomosynthesis was performed. The images were evaluated with
computer-aided detection.

[R CC synth-2D (1 of 2)]
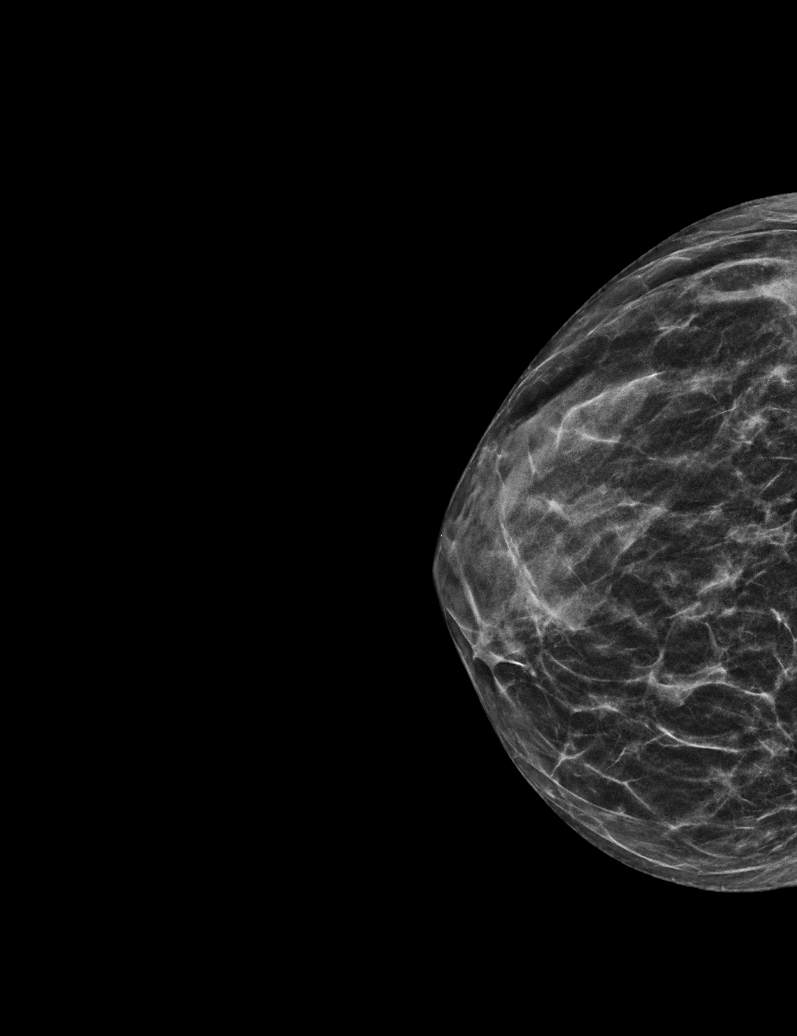

[R MLO synth-2D (1 of 2)]
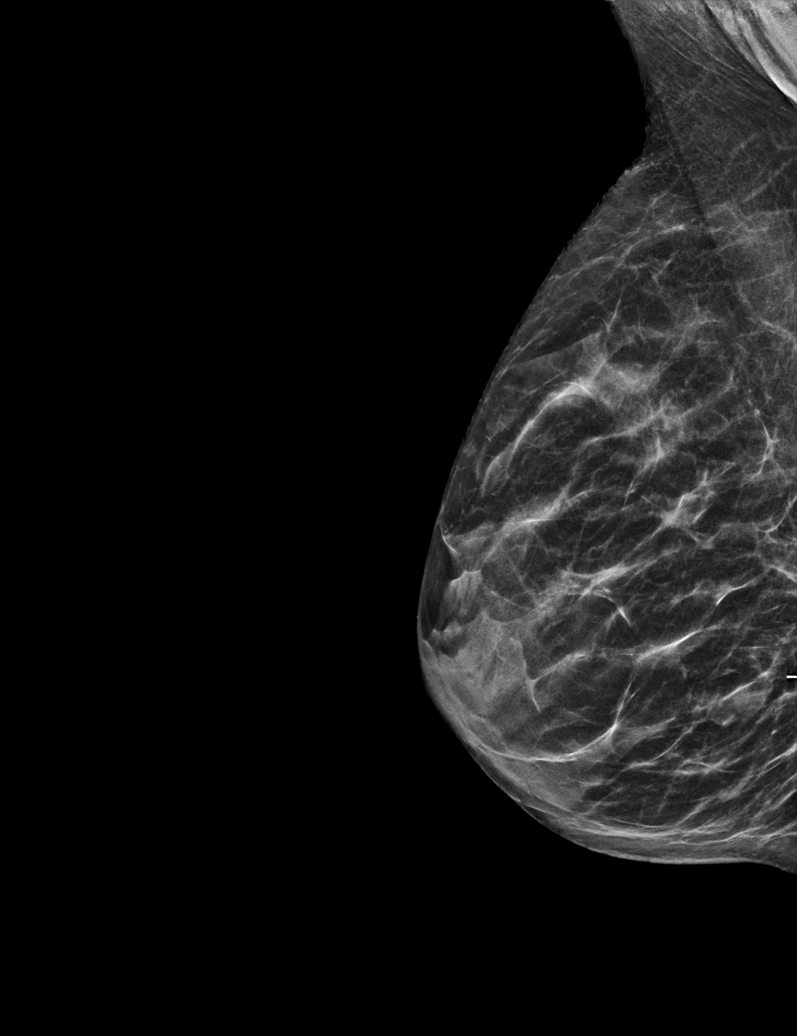

[L CC synth-2D]
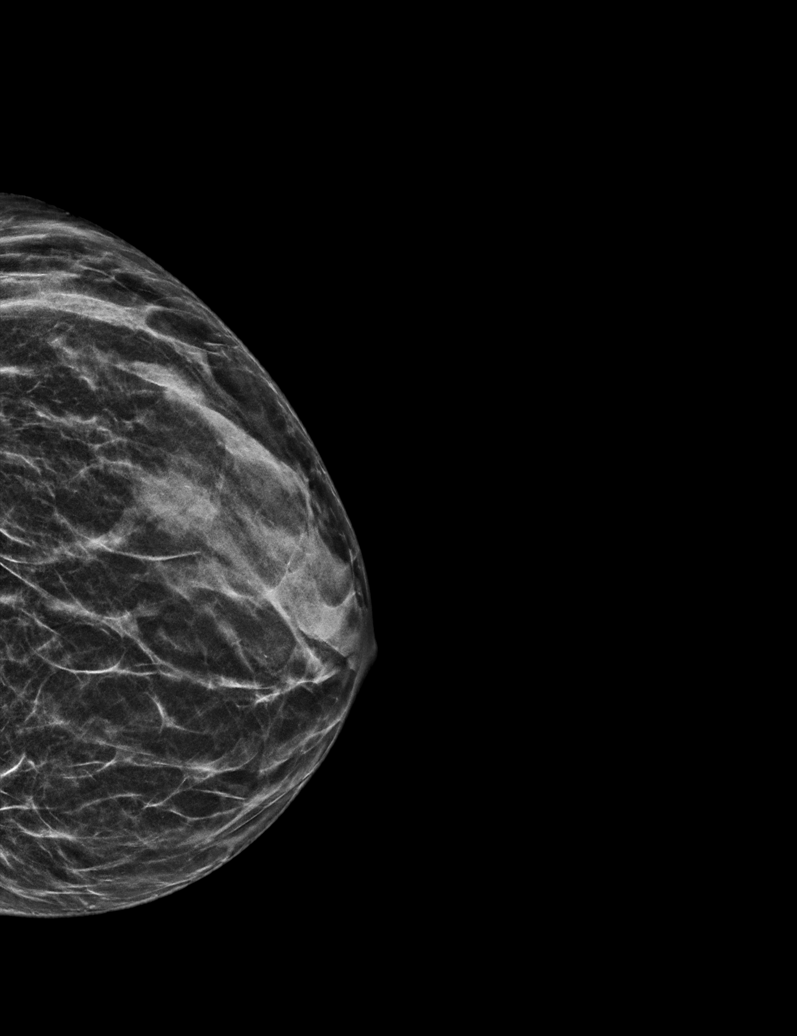

[R CC synth-2D (2 of 2)]
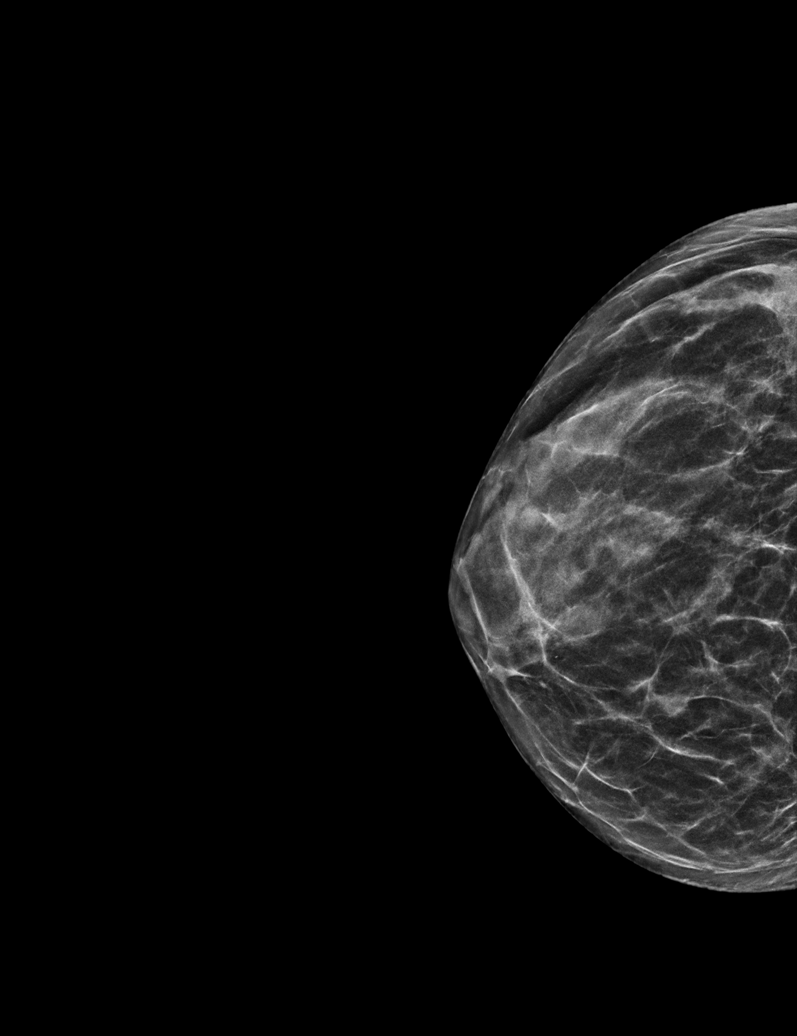

[L MLO synth-2D]
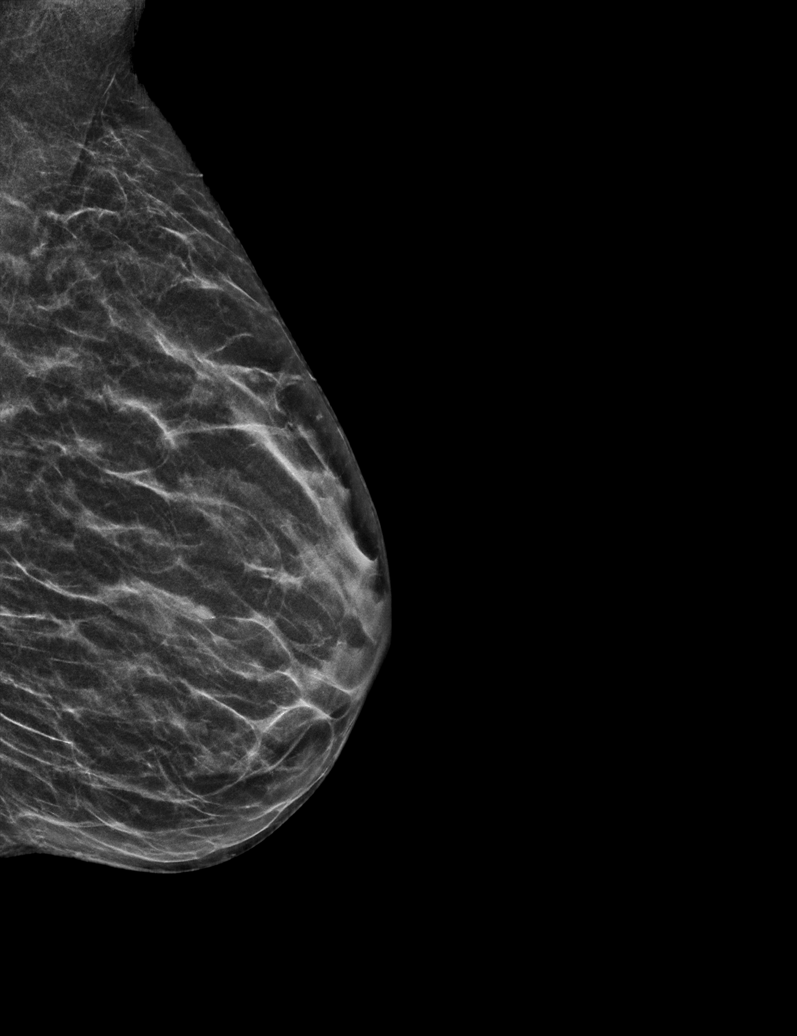

[R MLO synth-2D (2 of 2)]
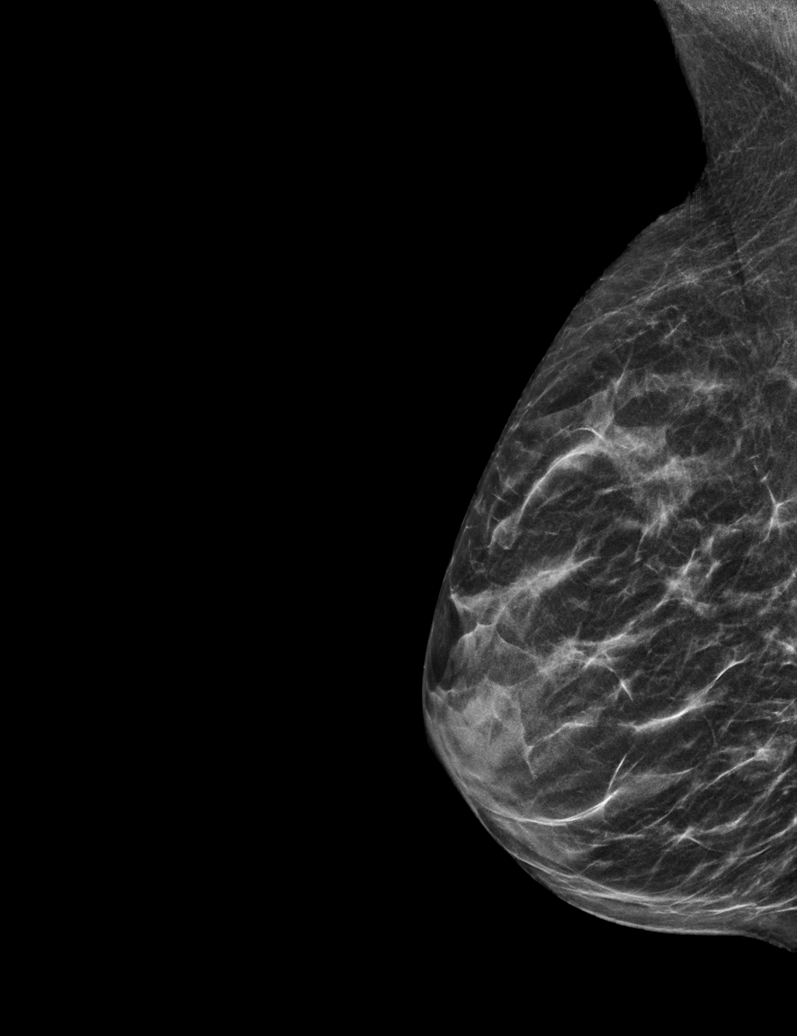

[6 of 36 positions shown; findings below may reference images not displayed]

ACR Breast Density Category c: The breast tissue is heterogeneously
dense, which may obscure small masses.
FINDINGS: There are no findings suspicious for malignancy.
IMPRESSION: No mammographic evidence of malignancy. A result letter of this
screening mammogram will be mailed directly to the patient.

RECOMMENDATION:
Screening mammogram in one year. (Code:Q3-W-BC3)

BI-RADS CATEGORY  1: Negative.

## 2023-04-01 ENCOUNTER — Telehealth: Payer: Self-pay | Admitting: Internal Medicine

## 2023-04-01 NOTE — Telephone Encounter (Signed)
Copied from CRM [id number]. Topic: Medicare AWV >> Apr 01, 2023 10:00 AM Payton Doughty wrote: Reason for CRM: Called LVM 04/01/2023 to schedule Annual Wellness Visit  Verlee Rossetti; Care Guide Ambulatory Clinical Support [place] [facility] Direct Dial: [phone]

## 2023-04-13 ENCOUNTER — Encounter: Payer: Medicare Other | Admitting: Internal Medicine

## 2023-04-18 HISTORY — PX: OTHER SURGICAL HISTORY: SHX169

## 2023-04-19 ENCOUNTER — Ambulatory Visit: Payer: Medicare Other | Admitting: *Deleted

## 2023-04-19 VITALS — Ht 60.05 in | Wt 97.0 lb

## 2023-04-19 DIAGNOSIS — Z0000 Encounter for general adult medical examination without abnormal findings: Secondary | ICD-10-CM

## 2023-04-19 NOTE — Patient Instructions (Signed)
Megan Mcintosh , Thank you for taking time to come for your Medicare Wellness Visit. I appreciate your ongoing commitment to your health goals. Please review the following plan we discussed and let me know if I can assist you in the future.   Referrals/Orders/Follow-Ups/Clinician Recommendations: Remember to update your Tetanus vaccine  This is a list of the screening recommended for you and due dates:  Health Maintenance  Topic Date Due   DTaP/Tdap/Td vaccine (2 - Td or Tdap) 12/13/2022   COVID-19 Vaccine (4 - 2023-24 season) 01/23/2023   Flu Shot  08/22/2023*   Mammogram  08/05/2023   Medicare Annual Wellness Visit  04/18/2024   Colon Cancer Screening  06/30/2031   Pneumonia Vaccine  Completed   DEXA scan (bone density measurement)  Completed   Hepatitis C Screening  Completed   Zoster (Shingles) Vaccine  Completed   HPV Vaccine  Aged Out  *Topic was postponed. The date shown is not the original due date.    Advanced directives: (Copy Requested) Please bring a copy of your health care power of attorney and living will to the office to be added to your chart at your convenience.  Next Medicare Annual Wellness Visit scheduled for next year: Yes 04/24/24 @ 1:40

## 2023-04-19 NOTE — Progress Notes (Signed)
Subjective:   ABBEE Mcintosh is a 67 y.o. female who presents for Medicare Annual (Subsequent) preventive examination.  Visit Complete: Virtual I connected with  Megan Mcintosh on 04/19/23 by a audio enabled telemedicine application and verified that I am speaking with the correct person using two identifiers.  Patient Location: Home  Provider Location: Office/Clinic  I discussed the limitations of evaluation and management by telemedicine. The patient expressed understanding and agreed to proceed.  Vital Signs: Because this visit was a virtual/telehealth visit, some criteria may be missing or patient reported. Any vitals not documented were not able to be obtained and vitals that have been documented are patient reported.  Patient Medicare AWV questionnaire was completed by the patient on 04/15/23; I have confirmed that all information answered by patient is correct and no changes since this date.  Cardiac Risk Factors include: advanced age (>24men, >69 women)     Objective:    Today's Vitals   04/19/23 1328  Weight: 97 lb (44 kg)  Height: 5' 0.05" (1.525 m)   Body mass index is 18.91 kg/m.     04/19/2023    1:38 PM 04/08/2022    2:51 PM 06/29/2021    7:02 AM 06/24/2017    2:04 AM 03/26/2016    9:00 AM 09/29/2015    1:07 PM 09/18/2015    9:16 AM  Advanced Directives  Does Patient Have a Medical Advance Directive? Yes Yes Yes No No;Yes Yes Yes  Type of Estate agent of [place];Living will Healthcare Power of [place];Living will   Living will Healthcare Power of [place];Living will Healthcare Power of [place];Living will  Does patient want to make changes to medical advance directive?  No - Patient declined    No - Patient declined   Copy of Healthcare Power of Attorney in Chart? No - copy requested No - copy requested    No - copy requested   Would patient like information on creating a medical advance directive?    No - Patient declined       Current  Medications (verified) Outpatient Encounter Medications as of 04/19/2023  Medication Sig   Calcium Carb-Cholecalciferol (CALCIUM-VITAMIN D) 500-200 MG-UNIT tablet Take 1 tablet by mouth daily.    cholecalciferol (VITAMIN D) 25 MCG (1000 UT) tablet Take 1,000 Units by mouth daily.   niacinamide 500 MG tablet Take 500 mg by mouth daily.   vitamin B-12 (CYANOCOBALAMIN) 500 MCG tablet Take 500 mcg by mouth daily.   No facility-administered encounter medications on file as of 04/19/2023.    Allergies (verified) Patient has no known allergies.   History: Past Medical History:  Diagnosis Date   Arthritis    Breast mass, right    benign   Cervical dysplasia 1980   s/p freezing   Fatigue    History of kidney stones 2017, 2019   Hypercholesteremia    Osteopenia of femoral neck    left   Pilonidal cyst    Pinguecula of right eye    removed by Leodis Sias   Vitamin D deficiency 2018   Wrist fracture 1967   Past Surgical History:  Procedure Laterality Date   BREAST BIOPSY Right 2012   BENIGN BREAST TISSUE WITH STROMAL FIBROSIS AND FOCAL FIBROADENOMA   BREAST SURGERY  05/2010   right breast , BENIGN [name]   CESAREAN SECTION     x 2   COLONOSCOPY N/A 03/26/2016   Procedure: COLONOSCOPY;  Surgeon: Scot Jun, MD;  Location:  [facility] ENDOSCOPY;  Service: Endoscopy;  Laterality: N/A;   COLONOSCOPY N/A 06/29/2021   Procedure: COLONOSCOPY;  Surgeon: Toney Reil, MD;  Location: [facility] ENDOSCOPY;  Service: Gastroenterology;  Laterality: N/A;   CYSTOSCOPY WITH STENT PLACEMENT Right 09/12/2015   Procedure: CYSTOSCOPY WITH STENT PLACEMENT;  Surgeon: Crist Fat, MD;  Location: [facility] ORS;  Service: Urology;  Laterality: Right;   CYSTOSCOPY/URETEROSCOPY/HOLMIUM LASER/STENT PLACEMENT Right 09/29/2015   Procedure: CYSTOSCOPY/URETEROSCOPY/HOLMIUM LASER/STENT PLACEMENT/ STENT EXCHANGE;  Surgeon: Vanna Scotland, MD;  Location: [facility] ORS;  Service: Urology;  Laterality: Right;    EXTRACORPOREAL SHOCK WAVE LITHOTRIPSY Left 06/16/2017   Procedure: EXTRACORPOREAL SHOCK WAVE LITHOTRIPSY (ESWL);  Surgeon: Riki Altes, MD;  Location: [facility] ORS;  Service: Urology;  Laterality: Left;   FOOT SURGERY  2006   [facility]:  silicone implants in both great toes    FOOT SURGERY Bilateral 2006   silicone implants both great toes   pilonidal  1979   cyst excised    skin cancer on face removed  04/18/2023   STONE EXTRACTION WITH BASKET Right 09/29/2015   Procedure: STONE EXTRACTION WITH BASKET;  Surgeon: Vanna Scotland, MD;  Location: [facility] ORS;  Service: Urology;  Laterality: Right;   TUBAL LIGATION  1990   Family History  Problem Relation Age of Onset   Aneurysm Mother 65       Hemorrhage CVA   Stroke Mother    Urolithiasis Mother    Kidney disease Mother    Birth defects Maternal Uncle    Cancer Neg Hx    Prostate cancer Neg Hx    Kidney cancer Neg Hx    Breast cancer Neg Hx    Social History   Socioeconomic History   Marital status: Married    Spouse name: Not on file   Number of children: Not on file   Years of education: Not on file   Highest education level: Not on file  Occupational History   Not on file  Tobacco Use   Smoking status: Never   Smokeless tobacco: Never  Vaping Use   Vaping status: Never Used  Substance and Sexual Activity   Alcohol use: Yes    Comment: occasionally on weekends   Drug use: No   Sexual activity: Yes    Birth control/protection: Post-menopausal  Other Topics Concern   Not on file  Social History Narrative   married   Social Determinants of Health   Financial Resource Strain: Low Risk  (04/15/2023)   Overall Financial Resource Strain (CARDIA)    Difficulty of Paying Living Expenses: Not hard at all  Food Insecurity: No Food Insecurity (04/15/2023)   Hunger Vital Sign    Worried About Running Out of Food in the Last Year: Never true    Ran Out of Food in the Last Year: Never true  Transportation Needs: No  Transportation Needs (04/15/2023)   PRAPARE - Administrator, Civil Service (Medical): No    Lack of Transportation (Non-Medical): No  Physical Activity: Sufficiently Active (04/15/2023)   Exercise Vital Sign    Days of Exercise per Week: 6 days    Minutes of Exercise per Session: 150+ min  Stress: No Stress Concern Present (04/15/2023)   [employer] of Occupational Health - Occupational Stress Questionnaire    Feeling of Stress : Only a little  Social Connections: Socially Integrated (04/15/2023)   Social Connection and Isolation Panel [NHANES]    Frequency of Communication with Friends and Family: More than three times  a week    Frequency of Social Gatherings with Friends and Family: More than three times a week    Attends Religious Services: More than 4 times per year    Active Member of [employer] or Organizations: Yes    Attends Engineer, structural: More than 4 times per year    Marital Status: Married    Tobacco Counseling Counseling given: Not Answered   Clinical Intake:  Pre-visit preparation completed: Yes  Pain : No/denies pain     BMI - recorded: 18.91 Nutritional Status: BMI <19  Underweight Nutritional Risks: None Diabetes: No  How often do you need to have someone help you when you read instructions, pamphlets, or other written materials from your doctor or pharmacy?: 1 - Never  Interpreter Needed?: No  Information entered by :: R. Jimma Ortman LPN   Activities of Daily Living    04/15/2023    9:59 AM 04/02/2023    9:23 AM  In your present state of health, do you have any difficulty performing the following activities:  Hearing? 0 0  Vision? 0 0  Comment glasses   Difficulty concentrating or making decisions? 0 0  Walking or climbing stairs? 0 0  Dressing or bathing? 0 0  Doing errands, shopping? 0 0  Preparing Food and eating ? N N  Using the Toilet? N N  In the past six months, have you accidently leaked urine? N N  Do you have  problems with loss of bowel control? N N  Managing your Medications? N N  Managing your Finances? N N  Housekeeping or managing your Housekeeping? N N    Patient Care Team: Sherlene Shams, MD as PCP - General (Internal Medicine)  Indicate any recent Medical Services you may have received from other than [facility] providers in the past year (date may be approximate).     Assessment:   This is a routine wellness examination for Megan Mcintosh.  Hearing/Vision screen Hearing Screening - Comments:: No issues Vision Screening - Comments:: glasses   Goals Addressed             This Visit's Progress    Patient Stated       Wants  to continue to stay active for as long as she can       Depression Screen    04/19/2023    1:32 PM 04/09/2022   10:06 AM 04/08/2022    2:53 PM 04/07/2021    2:45 PM 02/15/2017    1:40 PM 12/25/2015    9:37 AM  PHQ 2/9 Scores  PHQ - 2 Score 0 0 0 0 0 0  PHQ- 9 Score 0    0     Fall Risk    04/15/2023    9:59 AM 04/02/2023    9:23 AM 04/09/2022   10:06 AM 04/08/2022    2:55 PM 04/07/2021    2:45 PM  Fall Risk   Falls in the past year? 0 0 0 0 0  Number falls in past yr: 0 0  0   Injury with Fall? 0 0  0   Risk for fall due to : No Fall Risks  No Fall Risks  No Fall Risks  Follow up Falls prevention discussed;Falls evaluation completed  Falls evaluation completed Falls evaluation completed Falls evaluation completed    MEDICARE RISK AT HOME: Medicare Risk at Home Any stairs in or around the home?: Yes If so, are there any without handrails?: No Home free of loose  throw rugs in walkways, pet beds, electrical cords, etc?: Yes Adequate lighting in your home to reduce risk of falls?: Yes Life alert?: No Use of a cane, walker or w/c?: No Grab bars in the bathroom?: No Shower chair or bench in shower?: No Elevated toilet seat or a handicapped toilet?: No   Cognitive Function:    04/07/2021    2:59 PM  MMSE - Mini Mental State Exam  Orientation  to time 5  Orientation to Place 5  Registration 3  Attention/ Calculation 5  Recall 3  Language- name 2 objects 2  Language- repeat 1  Language- follow 3 step command 3  Language- read & follow direction 1  Write a sentence 1  Copy design 1  Total score 30        04/19/2023    1:38 PM 04/08/2022    3:02 PM  6CIT Screen  What Year? 0 points   What month? 0 points   What time? 0 points   Count back from 20 0 points   Months in reverse 0 points 0 points  Repeat phrase 0 points   Total Score 0 points     Immunizations Immunization History  Administered Date(s) Administered   Moderna Sars-Covid-2 Vaccination 07/20/2019, 08/17/2019, 06/03/2020   PNEUMOCOCCAL CONJUGATE-20 07/13/2022   Tdap 12/12/2012   Zoster Recombinant(Shingrix) 02/09/2022, 05/28/2022    TDAP status: Due, Education has been provided regarding the importance of this vaccine. Advised may receive this vaccine at local pharmacy or Health Dept. Aware to provide a copy of the vaccination record if obtained from local pharmacy or Health Dept. Verbalized acceptance and understanding.  Flu Vaccine status: Declined, Education has been provided regarding the importance of this vaccine but patient still declined. Advised may receive this vaccine at local pharmacy or Health Dept. Aware to provide a copy of the vaccination record if obtained from local pharmacy or Health Dept. Verbalized acceptance and understanding.  Pneumococcal vaccine status: Up to date  Covid-19 vaccine status: Information provided on how to obtain vaccines.   Qualifies for Shingles Vaccine? Yes   Zostavax completed No   Shingrix Completed?: Yes  Screening Tests Health Maintenance  Topic Date Due   DTaP/Tdap/Td (2 - Td or Tdap) 12/13/2022   INFLUENZA VACCINE  Never done   COVID-19 Vaccine (4 - 2023-24 season) 01/23/2023   Medicare Annual Wellness (AWV)  04/09/2023   MAMMOGRAM  08/05/2023   Colonoscopy  06/30/2031   Pneumonia Vaccine 58+  Years old  Completed   DEXA SCAN  Completed   Hepatitis C Screening  Completed   Zoster Vaccines- Shingrix  Completed   HPV VACCINES  Aged Out    Health Maintenance  Health Maintenance Due  Topic Date Due   DTaP/Tdap/Td (2 - Td or Tdap) 12/13/2022   INFLUENZA VACCINE  Never done   COVID-19 Vaccine (4 - 2023-24 season) 01/23/2023   Medicare Annual Wellness (AWV)  04/09/2023    Colorectal cancer screening: Type of screening: Colonoscopy. Completed 06/2021. Repeat every 10 years  Mammogram status: Completed 07/2022. Repeat every year  Bone Density status: Completed 05/2022. Results reflect: Bone density results: OSTEOPOROSIS. Repeat every 2 years.  Lung Cancer Screening: (Low Dose CT Chest recommended if Age 110-80 years, 20 pack-year currently smoking OR have quit w/in 15years.) does not qualify.     Additional Screening:  Hepatitis C Screening: does qualify; Completed 12/2014  Vision Screening: Recommended annual ophthalmology exams for early detection of glaucoma and other disorders of the eye. Is the  patient up to date with their annual eye exam?  Yes  Who is the provider or what is the name of the office in which the patient attends annual eye exams? [facility] If pt is not established with a provider, would they like to be referred to a provider to establish care? No .   Dental Screening: Recommended annual dental exams for proper oral hygiene   Community Resource Referral / Chronic Care Management: CRR required this visit?  No   CCM required this visit?  No     Plan:     I have personally reviewed and noted the following in the patient's chart:   Medical and social history Use of alcohol, tobacco or illicit drugs  Current medications and supplements including opioid prescriptions. Patient is not currently taking opioid prescriptions. Functional ability and status Nutritional status Physical activity Advanced directives List of other  physicians Hospitalizations, surgeries, and ER visits in previous 12 months Vitals Screenings to include cognitive, depression, and falls Referrals and appointments  In addition, I have reviewed and discussed with patient certain preventive protocols, quality metrics, and best practice recommendations. A written personalized care plan for preventive services as well as general preventive health recommendations were provided to patient.     Sydell Axon, LPN   [id number]   After Visit Summary: (MyChart) Due to this being a telephonic visit, the after visit summary with patients personalized plan was offered to patient via MyChart   Nurse Notes: None

## 2023-05-05 ENCOUNTER — Ambulatory Visit (INDEPENDENT_AMBULATORY_CARE_PROVIDER_SITE_OTHER): Payer: Medicare Other | Admitting: Internal Medicine

## 2023-05-05 ENCOUNTER — Encounter: Payer: Self-pay | Admitting: Internal Medicine

## 2023-05-05 VITALS — BP 118/64 | HR 49 | Ht 60.0 in | Wt 96.6 lb

## 2023-05-05 DIAGNOSIS — R5383 Other fatigue: Secondary | ICD-10-CM

## 2023-05-05 DIAGNOSIS — R7301 Impaired fasting glucose: Secondary | ICD-10-CM | POA: Diagnosis not present

## 2023-05-05 DIAGNOSIS — E538 Deficiency of other specified B group vitamins: Secondary | ICD-10-CM

## 2023-05-05 DIAGNOSIS — E782 Mixed hyperlipidemia: Secondary | ICD-10-CM

## 2023-05-05 DIAGNOSIS — M545 Low back pain, unspecified: Secondary | ICD-10-CM

## 2023-05-05 DIAGNOSIS — Z0000 Encounter for general adult medical examination without abnormal findings: Secondary | ICD-10-CM

## 2023-05-05 DIAGNOSIS — M810 Age-related osteoporosis without current pathological fracture: Secondary | ICD-10-CM

## 2023-05-05 DIAGNOSIS — D242 Benign neoplasm of left breast: Secondary | ICD-10-CM

## 2023-05-05 DIAGNOSIS — Z1231 Encounter for screening mammogram for malignant neoplasm of breast: Secondary | ICD-10-CM

## 2023-05-05 DIAGNOSIS — E559 Vitamin D deficiency, unspecified: Secondary | ICD-10-CM | POA: Diagnosis not present

## 2023-05-05 DIAGNOSIS — G8929 Other chronic pain: Secondary | ICD-10-CM

## 2023-05-05 LAB — LIPID PANEL
Cholesterol: 252 mg/dL — ABNORMAL HIGH (ref 0–200)
HDL: 120.6 mg/dL (ref >39.00)
LDL Cholesterol: 118 mg/dL — ABNORMAL HIGH (ref 0–99)
NonHDL: 131.5
Total CHOL/HDL Ratio: 2
Triglycerides: 67 mg/dL (ref 0.0–149.0)
VLDL: 13.4 mg/dL (ref 0.0–40.0)

## 2023-05-05 LAB — COMPREHENSIVE METABOLIC PANEL
ALT: 11 U/L (ref 0–35)
AST: 21 U/L (ref 0–37)
Albumin: 4.9 g/dL (ref 3.5–5.2)
Alkaline Phosphatase: 68 U/L (ref 39–117)
BUN: 16 mg/dL (ref 6–23)
CO2: 29 meq/L (ref 19–32)
Calcium: 9.8 mg/dL (ref 8.4–10.5)
Chloride: 102 meq/L (ref 96–112)
Creatinine, Ser: 0.77 mg/dL (ref 0.40–1.20)
GFR: 79.88 mL/min (ref >60.00)
Glucose, Bld: 85 mg/dL (ref 70–99)
Potassium: 4.8 meq/L (ref 3.5–5.1)
Sodium: 141 meq/L (ref 135–145)
Total Bilirubin: 0.7 mg/dL (ref 0.2–1.2)
Total Protein: 6.9 g/dL (ref 6.0–8.3)

## 2023-05-05 LAB — CBC WITH DIFFERENTIAL/PLATELET
Basophils Absolute: 0 10*3/uL (ref 0.0–0.1)
Basophils Relative: 0.5 % (ref 0.0–3.0)
Eosinophils Absolute: 0 10*3/uL (ref 0.0–0.7)
Eosinophils Relative: 0.6 % (ref 0.0–5.0)
HCT: 41.8 % (ref 36.0–46.0)
Hemoglobin: 14.2 g/dL (ref 12.0–15.0)
Lymphocytes Relative: 30.6 % (ref 12.0–46.0)
Lymphs Abs: 2.1 10*3/uL (ref 0.7–4.0)
MCHC: 33.9 g/dL (ref 30.0–36.0)
MCV: 91.2 fL (ref 78.0–100.0)
Monocytes Absolute: 0.5 10*3/uL (ref 0.1–1.0)
Monocytes Relative: 6.7 % (ref 3.0–12.0)
Neutro Abs: 4.3 10*3/uL (ref 1.4–7.7)
Neutrophils Relative %: 61.6 % (ref 43.0–77.0)
Platelets: 427 10*3/uL — ABNORMAL HIGH (ref 150.0–400.0)
RBC: 4.58 Mil/uL (ref 3.87–5.11)
RDW: 13.1 % (ref 11.5–15.5)
WBC: 7 10*3/uL (ref 4.0–10.5)

## 2023-05-05 LAB — LDL CHOLESTEROL, DIRECT: Direct LDL: 107 mg/dL

## 2023-05-05 LAB — VITAMIN D 25 HYDROXY (VIT D DEFICIENCY, FRACTURES): VITD: 59.78 ng/mL (ref 30.00–100.00)

## 2023-05-05 LAB — HEMOGLOBIN A1C: Hgb A1c MFr Bld: 5.7 % (ref 4.6–6.5)

## 2023-05-05 LAB — TSH: TSH: 2.02 u[IU]/mL (ref 0.35–5.50)

## 2023-05-05 MED ORDER — TETANUS-DIPHTH-ACELL PERTUSSIS 5-2.5-18.5 LF-MCG/0.5 IM SUSY: 0.5000 mL | PREFILLED_SYRINGE | Freq: Once | INTRAMUSCULAR | 0 refills | Status: AC

## 2023-05-05 MED ORDER — TETANUS-DIPHTH-ACELL PERTUSSIS 5-2.5-18.5 LF-MCG/0.5 IM SUSY: 0.5000 mL | PREFILLED_SYRINGE | Freq: Once | INTRAMUSCULAR | 0 refills | Status: DC

## 2023-05-05 NOTE — Progress Notes (Signed)
Patient ID: Megan Mcintosh, female    DOB: [birth year]  Age: 67 y.o. MRN: [id number]  The patient is here for  follow up and  management of other chronic and acute problems.   The risk factors are reflected in the social history.   The roster of all physicians providing medical care to patient - is listed in the Snapshot section of the chart.   Activities of daily living:  The patient is 100% independent in all ADLs: dressing, toileting, feeding as well as independent mobility   Home safety : The patient has smoke detectors in the home. They wear seatbelts.  There are no unsecured firearms at home. There is no violence in the home.    There is no risks for hepatitis, STDs or HIV. There is no   history of blood transfusion. They have no travel history to infectious disease endemic areas of the world.   The patient has seen their dentist in the last six month. They have seen their eye doctor in the last year. The patinet  denies slight hearing difficulty with regard to whispered voices and some television programs.  They have deferred audiologic testing in the last year.  They do not  have excessive sun exposure. Discussed the need for sun protection: hats, long sleeves and use of sunscreen if there is significant sun exposure.    Diet: the importance of a healthy diet is discussed. They do have a healthy diet.   The benefits of regular aerobic exercise were discussed. The patient  exercises  3 to 5 days per week  for  60 minutes.    Depression screen: there are no signs or vegative symptoms of depression- irritability, change in appetite, anhedonia, sadness/tearfullness.   The following portions of the patient's history were reviewed and updated as appropriate: allergies, current medications, past family history, past medical history,  past surgical history, past social history  and problem list.   Visual acuity was not assessed per patient preference since the patient has regular follow up with an   ophthalmologist. Hearing and body mass index were assessed and reviewed.    During the course of the visit the patient was educated and counseled about appropriate screening and preventive services including : fall prevention , diabetes screening, nutrition counseling, colorectal cancer screening, and recommended immunizations.    Chief Complaint:  None.   Review of Symptoms  Patient denies headache, fevers, malaise, unintentional weight loss, skin rash, eye pain, sinus congestion and sinus pain, sore throat, dysphagia,  hemoptysis , cough, dyspnea, wheezing, chest pain, palpitations, orthopnea, edema, abdominal pain, nausea, melena, diarrhea, constipation, flank pain, dysuria, hematuria, urinary  Frequency, nocturia, numbness, tingling, seizures,  Focal weakness, Loss of consciousness,  Tremor, insomnia, depression, anxiety, and suicidal ideation.    Physical Exam:  BP 118/64   Pulse (!) 49   Ht 5' (1.524 m)   Wt 96 lb 9.6 oz (43.8 kg)   LMP 05/17/2011   SpO2 99%   BMI 18.87 kg/m    Physical Exam Vitals reviewed.  Constitutional:      General: She is not in acute distress.    Appearance: Normal appearance. She is normal weight. She is not ill-appearing, toxic-appearing or diaphoretic.  HENT:     Head: Normocephalic.  Eyes:     General: No scleral icterus.       Right eye: No discharge.        Left eye: No discharge.     Conjunctiva/sclera: Conjunctivae normal.  Cardiovascular:     Rate and Rhythm: Normal rate and regular rhythm.     Heart sounds: Normal heart sounds.  Pulmonary:     Effort: Pulmonary effort is normal. No respiratory distress.     Breath sounds: Normal breath sounds.  Musculoskeletal:        General: Normal range of motion.  Skin:    General: Skin is warm and dry.  Neurological:     General: No focal deficit present.     Mental Status: She is alert and oriented to person, place, and time. Mental status is at baseline.  Psychiatric:        Mood and  Affect: Mood normal.        Behavior: Behavior normal.        Thought Content: Thought content normal.        Judgment: Judgment normal.    Assessment and Plan: Impaired fasting glucose -     Comprehensive metabolic panel -     Hemoglobin A1c  Other fatigue -     CBC with Differential/Platelet -     TSH  Mixed hyperlipidemia -     Lipid panel -     LDL cholesterol, direct  Encounter for screening mammogram for malignant neoplasm of breast -     3D Screening Mammogram, Left and Right; Future  Osteoporosis of femur without pathological fracture Assessment & Plan: Untreated per patient preference.  Her T scores have improved to ostepenia range in most places  without therapy based on comparison of Jan 2024 T scores with prior  DEXA scans      Vitamin D deficiency -     VITAMIN D 25 Hydroxy (Vit-D Deficiency, Fractures)  Visit for preventive health examination Assessment & Plan: age appropriate education and counseling updated, referrals for preventative services and immunizations addressed, dietary and smoking counseling addressed, most recent labs reviewed.  I have personally reviewed and have noted:   1) the patient's medical and social history 2) The pt's use of alcohol, tobacco, and illicit drugs 3) The patient's current medications and supplements 4) Functional ability including ADL's, fall risk, home safety risk, hearing and visual impairment 5) Diet and physical activities 6) Evidence for depression or mood disorder 7) The patient's height, weight, and BMI have been recorded in the chart    I have made referrals, and provided counseling and education based on review of the above    Fibroadenoma of left breast Assessment & Plan: History of Right breast biopsy which was benign .  Annual mammogram was normal  in [facility] 2024   Chronic midline low back pain without sciatica Assessment & Plan: Brought on by house cleaning,  extended bending.  No prior films.    T   scores normal, likely inflated by bone spurs.   B12 deficiency Assessment & Plan: Supplemented.   Lab Results  Component Value Date   VITAMINB12 >1526 (H) 03/06/2020      Other orders -     Tetanus-Diphth-Acell Pertussis; Inject 0.5 mLs into the muscle once for 1 dose.  Dispense: 0.5 mL; Refill: 0    I provided  30 minutes  during this encounter reviewing patient's current problems and past surgeries, labs and imaging studies, providing counseling on the above mentioned problems I n a face to face visit  , and coordination  of care .  Return in about 1 year (around 05/04/2024).  Sherlene Shams, MD

## 2023-05-05 NOTE — Patient Instructions (Addendum)
Please schedule your TdaP vaccine at [facility]    Your annual mammogram  is due in mid March and has been ordered .    Delford Field prefers to have you schedule it yourself ,   so please  call to make your appointment [phone]    Your next screening for colon CA will be in 2033 (sooner if your bowel habits change)  Continue vitamin D and b12 supplementation  (I'm checking D today )

## 2023-05-05 NOTE — Assessment & Plan Note (Signed)
Untreated per patient preference.  Her T scores have improved to ostepenia range in most places  without therapy based on comparison of Jan 2024 T scores with prior  DEXA scans

## 2023-05-07 NOTE — Assessment & Plan Note (Signed)
Brought on by house cleaning,  extended bending.  No prior films.    T  scores normal, likely inflated by bone spurs.

## 2023-05-07 NOTE — Assessment & Plan Note (Signed)

## 2023-05-07 NOTE — Assessment & Plan Note (Signed)
Supplemented.   Lab Results  Component Value Date   [id number] >1526 (H) 03/06/2020

## 2023-05-07 NOTE — Assessment & Plan Note (Signed)
History of Right breast biopsy which was benign .  Annual mammogram was normal  in [facility] 2024

## 2023-05-25 HISTORY — PX: OTHER SURGICAL HISTORY: SHX169

## 2023-08-09 ENCOUNTER — Ambulatory Visit
Admission: RE | Admit: 2023-08-09 | Discharge: 2023-08-09 | Disposition: A | Discharge status: Home / Self Care | Payer: Medicare Other | Source: Ambulatory Visit | Attending: Internal Medicine | Admitting: Internal Medicine

## 2023-08-09 DIAGNOSIS — Z1231 Encounter for screening mammogram for malignant neoplasm of breast: Secondary | ICD-10-CM | POA: Diagnosis present

## 2023-08-15 ENCOUNTER — Encounter: Payer: Self-pay | Admitting: Internal Medicine

## 2023-08-16 ENCOUNTER — Ambulatory Visit (INDEPENDENT_AMBULATORY_CARE_PROVIDER_SITE_OTHER): Admitting: Family Medicine

## 2023-08-16 ENCOUNTER — Encounter: Payer: Self-pay | Admitting: Family Medicine

## 2023-08-16 VITALS — BP 110/62 | Temp 97.7°F | Resp 20 | Ht 60.0 in | Wt 97.2 lb

## 2023-08-16 DIAGNOSIS — E538 Deficiency of other specified B group vitamins: Secondary | ICD-10-CM

## 2023-08-16 DIAGNOSIS — E559 Vitamin D deficiency, unspecified: Secondary | ICD-10-CM

## 2023-08-16 DIAGNOSIS — N200 Calculus of kidney: Secondary | ICD-10-CM

## 2023-08-16 DIAGNOSIS — R319 Hematuria, unspecified: Secondary | ICD-10-CM

## 2023-08-16 HISTORY — DX: Calculus of kidney: N20.0

## 2023-08-16 LAB — CBC WITH DIFFERENTIAL/PLATELET
Basophils Absolute: 0 10*3/uL (ref 0.0–0.1)
Basophils Relative: 0.5 % (ref 0.0–3.0)
Eosinophils Absolute: 0.1 10*3/uL (ref 0.0–0.7)
Eosinophils Relative: 2 % (ref 0.0–5.0)
HCT: 41.6 % (ref 36.0–46.0)
Hemoglobin: 13.9 g/dL (ref 12.0–15.0)
Lymphocytes Relative: 25.4 % (ref 12.0–46.0)
Lymphs Abs: 1.4 10*3/uL (ref 0.7–4.0)
MCHC: 33.3 g/dL (ref 30.0–36.0)
MCV: 91.4 fl (ref 78.0–100.0)
Monocytes Absolute: 0.4 10*3/uL (ref 0.1–1.0)
Monocytes Relative: 7.9 % (ref 3.0–12.0)
Neutro Abs: 3.6 10*3/uL (ref 1.4–7.7)
Neutrophils Relative %: 64.2 % (ref 43.0–77.0)
Platelets: 361 10*3/uL (ref 150.0–400.0)
RBC: 4.55 Mil/uL (ref 3.87–5.11)
RDW: 13.1 % (ref 11.5–15.5)
WBC: 5.6 10*3/uL (ref 4.0–10.5)

## 2023-08-16 LAB — POC URINALSYSI DIPSTICK (AUTOMATED)
Bilirubin, UA: NEGATIVE
Glucose, UA: NEGATIVE
Ketones, UA: NEGATIVE
Nitrite, UA: NEGATIVE
Protein, UA: NEGATIVE
Spec Grav, UA: 1.015 (ref 1.010–1.025)
Urobilinogen, UA: 0.2 U/dL
pH, UA: 7 (ref 5.0–8.0)

## 2023-08-16 LAB — COMPREHENSIVE METABOLIC PANEL
ALT: 12 U/L (ref 0–35)
AST: 18 U/L (ref 0–37)
Albumin: 4.5 g/dL (ref 3.5–5.2)
Alkaline Phosphatase: 74 U/L (ref 39–117)
BUN: 13 mg/dL (ref 6–23)
CO2: 30 meq/L (ref 19–32)
Calcium: 9.5 mg/dL (ref 8.4–10.5)
Chloride: 103 meq/L (ref 96–112)
Creatinine, Ser: 0.71 mg/dL (ref 0.40–1.20)
GFR: 87.87 mL/min (ref >60.00)
Glucose, Bld: 86 mg/dL (ref 70–99)
Potassium: 4.2 meq/L (ref 3.5–5.1)
Sodium: 140 meq/L (ref 135–145)
Total Bilirubin: 0.7 mg/dL (ref 0.2–1.2)
Total Protein: 6.8 g/dL (ref 6.0–8.3)

## 2023-08-16 LAB — URINALYSIS, ROUTINE W REFLEX MICROSCOPIC
Bilirubin Urine: NEGATIVE
Ketones, ur: NEGATIVE
Leukocytes,Ua: NEGATIVE
Nitrite: NEGATIVE
Specific Gravity, Urine: 1.01 (ref 1.000–1.030)
Total Protein, Urine: NEGATIVE
Urine Glucose: NEGATIVE
Urobilinogen, UA: 0.2 (ref 0.0–1.0)
pH: 7 (ref 5.0–8.0)

## 2023-08-16 LAB — VITAMIN B12: Vitamin B-12: 1113 pg/mL — ABNORMAL HIGH (ref 211–911)

## 2023-08-16 LAB — VITAMIN D 25 HYDROXY (VIT D DEFICIENCY, FRACTURES): VITD: 55.17 ng/mL (ref 30.00–100.00)

## 2023-08-16 NOTE — Progress Notes (Signed)
 SUBJECTIVE:   Chief Complaint  Patient presents with   Hematuria    Since yesterday   HPI Presents for acute visit  Discussed the use of AI scribe software for clinical note transcription with the patient, who gave verbal consent to proceed.  History of Present Illness Megan PHILLEY "SUSIE" is a 68 year old female with a history of kidney stones who presents with hematuria.  She noticed hematuria after playing tennis for two hours. Initially, the urine appeared bloody but lightened over subsequent urinations. By late yesterday and early this morning, she did not notice any blood. She describes a sensation of 'something's going on' but states it is the least severe episode she has experienced since her first stone at age 33.  No associated pain, fever, or back pain. No urinary frequency, urgency, or burning sensation during urination. No abdominal pain, pressure, or unusual symptoms. She confirms the blood was in her urine and not vaginal, as she has not had a menstrual period in almost ten years. She denies recent sexual activity and uses Dove soap without perfumes for hygiene.  She has a history of kidney stones, with the last occurrence approximately 25-26 years ago. In 2019, imaging identified small, non-obstructive stones described as 'tiny' and 'tucked in,' with a possibility of them never moving again. She describes this episode as the least severe since her first stone at age 40.  She is athletic, regularly plays tennis, and maintains good hydration practices. She drinks water with a small amount of diet cran-cherry juice to aid in hydration.    PERTINENT PMH / PSH: As above  OBJECTIVE:  BP 110/62   Temp 97.7 F (36.5 C)   Resp 20   Ht 5' (1.524 m)   Wt 97 lb 4 oz (44.1 kg)   LMP 05/17/2011   SpO2 99%   BMI 18.99 kg/m    Physical Exam Vitals reviewed.  Constitutional:      General: She is not in acute distress.    Appearance: Normal appearance. She is normal  weight. She is not ill-appearing, toxic-appearing or diaphoretic.  Eyes:     General:        Right eye: No discharge.        Left eye: No discharge.     Conjunctiva/sclera: Conjunctivae normal.  Cardiovascular:     Rate and Rhythm: Normal rate and regular rhythm.     Heart sounds: Normal heart sounds.  Pulmonary:     Effort: Pulmonary effort is normal.     Breath sounds: Normal breath sounds.  Abdominal:     General: Bowel sounds are normal.     Palpations: Abdomen is soft.     Tenderness: There is no abdominal tenderness. There is no right CVA tenderness or left CVA tenderness.  Musculoskeletal:        General: Normal range of motion.  Skin:    General: Skin is warm and dry.  Neurological:     General: No focal deficit present.     Mental Status: She is alert and oriented to person, place, and time. Mental status is at baseline.  Psychiatric:        Mood and Affect: Mood normal.        Behavior: Behavior normal.        Thought Content: Thought content normal.        Judgment: Judgment normal.           05/05/2023   11:31 AM 04/19/2023  1:32 PM 04/09/2022   10:06 AM 04/08/2022    2:53 PM 04/07/2021    2:45 PM  Depression screen PHQ 2/9  Decreased Interest 0 0 0 0 0  Down, Depressed, Hopeless 0 0 0 0 0  PHQ - 2 Score 0 0 0 0 0  Altered sleeping 0 0     Tired, decreased energy 0 0     Change in appetite 0 0     Feeling bad or failure about yourself  0 0     Trouble concentrating 0 0     Moving slowly or fidgety/restless 0 0     Suicidal thoughts 0 0     PHQ-9 Score 0 0     Difficult doing work/chores Not difficult at all Not difficult at all          No data to display          ASSESSMENT/PLAN:  Hematuria, unspecified type Assessment & Plan: Intermittent hematuria post-exercise without pain or fever. Possible UTI or stone passage. Urinalysis shows blood and leukocytes, no nitrates. - Send urine sample for further evaluation to rule out infection. -  Advise hydration. - Provide sieve for stone collection if passed. - Instruct to monitor for severe pain or fever and call if these occur. She understands the plan and when to seek further medical attention. Informed consent provided regarding waiting for urine culture results before starting antibiotics unless symptoms worsen - Follow up once urine culture results are available to determine if antibiotics are needed. - Instruct to call if symptoms worsen or if new symptoms develop.  Orders: -     Urine Culture -     Urinalysis, Routine w reflex microscopic -     POCT Urinalysis Dipstick (Automated) -     CBC with Differential/Platelet -     Comprehensive metabolic panel with GFR  B12 deficiency -     Vitamin B12  Vitamin D deficiency -     VITAMIN D 25 Hydroxy (Vit-D Deficiency, Fractures)  Nephrolithiasis Assessment & Plan: Small, non-obstructive stones noted in 2019. Current symptoms suggest possible stone passage without acute pain. - Advise continued hydration and dietary modifications to prevent stone formation. - Monitor for symptoms indicative of stone passage, such as severe pain. - Consider imaging and Flomax if symptoms indicative of stone passage develop.       Addendum 03/27 Patient messaged notifying of increased pain over night and episode of vomiting.  No fevers.  Spoke with patient and has not had further symptoms.  Continues to have hematuria.  Recommend CT renal study and initiating Flomax 0.4 mg daily.  Agreeable to plan. Strict return precautions provided.  PDMP reviewed  Return if symptoms worsen or fail to improve, for PCP.  Dana Allan, MD

## 2023-08-16 NOTE — Patient Instructions (Addendum)
 It was a pleasure meeting you today. Thank you for allowing me to take part in your health care.  Our goals for today as we discussed include:  Urine positive for large amount of blood and small amount of White blood cells.   Sending for further evaluation  Strain urine for kidney stone collection.  If develops any fever, back pain, abdominal pain please notify MD  We will get some labs today.  If they are abnormal or we need to do something about them, I will call you.  If they are normal, I will send you a message on MyChart (if it is active) or a letter in the mail.  If you don't hear from [place] in 2 weeks, please call the office at the number below.     This is a list of the screening recommended for you and due dates:  Health Maintenance  Topic Date Due   COVID-19 Vaccine (4 - 2024-25 season) 01/23/2023   Flu Shot  08/22/2023*   Medicare Annual Wellness Visit  04/18/2024   Mammogram  08/08/2024   Colon Cancer Screening  06/30/2031   DTaP/Tdap/Td vaccine (3 - Td or Tdap) 05/25/2033   Pneumonia Vaccine  Completed   DEXA scan (bone density measurement)  Completed   Hepatitis C Screening  Completed   Zoster (Shingles) Vaccine  Completed   HPV Vaccine  Aged Out  *Topic was postponed. The date shown is not the original due date.     If you have any questions or concerns, please do not hesitate to call the office at [phone].  I look forward to our next visit and until then take care and stay safe.  Regards,   Dana Allan, MD   [facility]

## 2023-08-17 LAB — URINE CULTURE
MICRO NUMBER:: 16244784
SPECIMEN QUALITY:: ADEQUATE

## 2023-08-18 ENCOUNTER — Encounter: Payer: Self-pay | Admitting: Family Medicine

## 2023-08-18 ENCOUNTER — Other Ambulatory Visit: Payer: Self-pay | Admitting: Family Medicine

## 2023-08-18 DIAGNOSIS — R1011 Right upper quadrant pain: Secondary | ICD-10-CM

## 2023-08-18 MED ORDER — ONDANSETRON HCL 4 MG PO TABS: 4.0000 mg | ORAL_TABLET | Freq: Three times a day (TID) | ORAL | 0 refills | Status: DC | PRN

## 2023-08-18 MED ORDER — OXYCODONE HCL 5 MG PO TABS: 5.0000 mg | ORAL_TABLET | Freq: Four times a day (QID) | ORAL | 0 refills | Status: AC | PRN

## 2023-08-18 MED ORDER — TAMSULOSIN HCL 0.4 MG PO CAPS: 0.4000 mg | ORAL_CAPSULE | Freq: Every day | ORAL | 0 refills | Status: AC

## 2023-08-19 NOTE — Telephone Encounter (Signed)
 noted

## 2023-08-21 ENCOUNTER — Encounter: Payer: Self-pay | Admitting: Family Medicine

## 2023-08-21 DIAGNOSIS — R319 Hematuria, unspecified: Secondary | ICD-10-CM | POA: Insufficient documentation

## 2023-08-21 NOTE — Assessment & Plan Note (Addendum)
 Small, non-obstructive stones noted in 2019. Current symptoms suggest possible stone passage without acute pain. - Advise continued hydration and dietary modifications to prevent stone formation. - Monitor for symptoms indicative of stone passage, such as severe pain. - Consider imaging and Flomax if symptoms indicative of stone passage develop.

## 2023-08-21 NOTE — Assessment & Plan Note (Addendum)
 Intermittent hematuria post-exercise without pain or fever. Possible UTI or stone passage. Urinalysis shows blood and leukocytes, no nitrates. - Send urine sample for further evaluation to rule out infection. - Advise hydration. - Provide sieve for stone collection if passed. - Instruct to monitor for severe pain or fever and call if these occur. She understands the plan and when to seek further medical attention. Informed consent provided regarding waiting for urine culture results before starting antibiotics unless symptoms worsen - Follow up once urine culture results are available to determine if antibiotics are needed. - Instruct to call if symptoms worsen or if new symptoms develop.

## 2023-08-22 ENCOUNTER — Telehealth: Payer: Self-pay

## 2023-08-22 NOTE — Telephone Encounter (Signed)
 Spoke to patient to notify of scheduled appointment for CT Renal Study. Patient verbalized she spoke with someone to make her aware. Patient verbalized understanding and has no further questions at this moment.

## 2023-08-22 NOTE — Telephone Encounter (Signed)
 Copied from CRM [id number]. Topic: General - Other >> Aug 22, 2023 10:01 AM Cammy Copa D wrote: Reason for CRM: PT returning missed call - PT would like to notify that she has scheduled her CT, please call back.

## 2023-08-23 ENCOUNTER — Ambulatory Visit
Admission: RE | Admit: 2023-08-23 | Discharge: 2023-08-23 | Disposition: A | Discharge status: Home / Self Care | Source: Ambulatory Visit | Attending: Family Medicine | Admitting: Family Medicine

## 2023-08-23 DIAGNOSIS — R1011 Right upper quadrant pain: Secondary | ICD-10-CM | POA: Insufficient documentation

## 2024-04-24 ENCOUNTER — Ambulatory Visit: Payer: Medicare Other | Admitting: *Deleted

## 2024-04-24 VITALS — Ht 60.6 in | Wt 97.0 lb

## 2024-04-24 DIAGNOSIS — Z0000 Encounter for general adult medical examination without abnormal findings: Secondary | ICD-10-CM

## 2024-04-24 DIAGNOSIS — Z1231 Encounter for screening mammogram for malignant neoplasm of breast: Secondary | ICD-10-CM

## 2024-04-24 NOTE — Progress Notes (Signed)
 Chief Complaint  Patient presents with   Medicare Wellness     Subjective:   Megan Mcintosh is a 68 y.o. female who presents for a Medicare Annual Wellness Visit.  Visit info / Clinical Intake: Medicare Wellness Visit Type:: Subsequent Annual Wellness Visit Persons participating in visit and providing information:: patient Medicare Wellness Visit Mode:: Telephone If telephone:: video declined Since this visit was completed virtually, some vitals may be partially provided or unavailable. Missing vitals are due to the limitations of the virtual format.: Unable to obtain vitals - no equipment If Telephone or Video please confirm:: I connected with patient using audio/video enable telemedicine. I verified patient identity with two identifiers, discussed telehealth limitations, and patient agreed to proceed. Patient Location:: Home Provider Location:: Office/Home Pre-visit prep was completed: yes AWV questionnaire completed by patient prior to visit?: yes Date:: 04/20/24 Living arrangements:: (!) lives alone Patient's Overall Health Status Rating: excellent Typical amount of pain: none Does pain affect daily life?: no Are you currently prescribed opioids?: (!) yes  Dietary Habits and Nutritional Risks How many meals a day?: 2 Eats fruit and vegetables daily?: (!) no Most meals are obtained by: preparing own meals In the last 2 weeks, have you had any of the following?: none Diabetic:: no  Functional Status Activities of Daily Living (to include ambulation/medication): (Patient-Rptd) Independent Ambulation: (Patient-Rptd) Independent Medication Administration: Independent Home Management (perform basic housework or laundry): (Patient-Rptd) Independent Manage your own finances?: yes Primary transportation is: driving Concerns about vision?: no *vision screening is required for WTM* Concerns about hearing?: no  Fall Screening Falls in the past year?: (Patient-Rptd) 0 Number of  falls in past year: (Patient-Rptd) 0 Was there an injury with Fall?: (Patient-Rptd) 0 Fall Risk Category Calculator: (Patient-Rptd) 0 Patient Fall Risk Level: (Patient-Rptd) Low Fall Risk  Fall Risk Patient at Risk for Falls Due to: No Fall Risks Fall risk Follow up: Falls evaluation completed; Falls prevention discussed  Home and Transportation Safety: All rugs have non-skid backing?: yes All stairs or steps have railings?: (!) no Grab bars in the bathtub or shower?: (!) no Have non-skid surface in bathtub or shower?: (!) no Good home lighting?: yes Regular seat belt use?: yes Hospital stays in the last year:: no  Cognitive Assessment Difficulty concentrating, remembering, or making decisions? : yes Will 6CIT or Mini Cog be Completed: yes What year is it?: 0 points What month is it?: 0 points Give patient an address phrase to remember (5 components): [address] [place] About what time is it?: 0 points Count backwards from 20 to 1: 0 points Say the months of the year in reverse: 0 points Repeat the address phrase from earlier: 0 points 6 CIT Score: 0 points  Advance Directives (For Healthcare) Does Patient Have a Medical Advance Directive?: Yes Does patient want to make changes to medical advance directive?: No - Patient declined Type of Advance Directive: Healthcare Power of [place]; Living will Copy of Healthcare Power of Attorney in Chart?: No - copy requested Copy of Living Will in Chart?: No - copy requested  Reviewed/Updated  Reviewed/Updated: Reviewed All (Medical, Surgical, Family, Medications, Allergies, Care Teams, Patient Goals)    Allergies (verified) Patient has no known allergies.   Current Medications (verified) Outpatient Encounter Medications as of 04/24/2024  Medication Sig   Calcium Carb-Cholecalciferol (CALCIUM-VITAMIN D ) 500-200 MG-UNIT tablet Take 1 tablet by mouth daily.    cholecalciferol (VITAMIN D ) 25 MCG (1000 UT) tablet Take 1,000  Units by mouth daily.  niacinamide 500 MG tablet Take 500 mg by mouth daily.   tamsulosin  (FLOMAX ) 0.4 MG CAPS capsule Take 1 capsule (0.4 mg total) by mouth daily. (Patient taking differently: Take 0.4 mg by mouth daily as needed.)   vitamin B-12 (CYANOCOBALAMIN ) 500 MCG tablet Take 500 mcg by mouth daily.   [DISCONTINUED] ondansetron  (ZOFRAN ) 4 MG tablet Take 1 tablet (4 mg total) by mouth every 8 (eight) hours as needed for nausea or vomiting.   No facility-administered encounter medications on file as of 04/24/2024.    History: Past Medical History:  Diagnosis Date   Arthritis    Breast mass, right    benign   Cervical dysplasia 1980   s/p freezing   Fatigue    History of kidney stones 2017, 2019   Hypercholesteremia    Osteopenia of femoral neck    left   Pilonidal cyst    Pinguecula of right eye    removed by Todd Glenn   Vitamin D  deficiency 2018   Wrist fracture 1967   Past Surgical History:  Procedure Laterality Date   BREAST BIOPSY Right 2012   BENIGN BREAST TISSUE WITH STROMAL FIBROSIS AND FOCAL FIBROADENOMA   BREAST SURGERY  05/2010   right breast , BENIGN [name]   CESAREAN SECTION     x 2   COLONOSCOPY N/A 03/26/2016   Procedure: COLONOSCOPY;  Surgeon: Lamar ONEIDA Holmes, MD;  Location: [facility] ENDOSCOPY;  Service: Endoscopy;  Laterality: N/A;   COLONOSCOPY N/A 06/29/2021   Procedure: COLONOSCOPY;  Surgeon: Unk Corinn Skiff, MD;  Location: [facility] ENDOSCOPY;  Service: Gastroenterology;  Laterality: N/A;   COSMETIC SURGERY  November 2016   CYSTOSCOPY WITH STENT PLACEMENT Right 09/12/2015   Procedure: CYSTOSCOPY WITH STENT PLACEMENT;  Surgeon: Morene LELON Salines, MD;  Location: [facility] ORS;  Service: Urology;  Laterality: Right;   CYSTOSCOPY/URETEROSCOPY/HOLMIUM LASER/STENT PLACEMENT Right 09/29/2015   Procedure: CYSTOSCOPY/URETEROSCOPY/HOLMIUM LASER/STENT PLACEMENT/ STENT EXCHANGE;  Surgeon: Rosina Riis, MD;  Location: [facility] ORS;  Service: Urology;   Laterality: Right;   EXTRACORPOREAL SHOCK WAVE LITHOTRIPSY Left 06/16/2017   Procedure: EXTRACORPOREAL SHOCK WAVE LITHOTRIPSY (ESWL);  Surgeon: Twylla Glendia BROCKS, MD;  Location: [facility] ORS;  Service: Urology;  Laterality: Left;   EYE SURGERY  1987   FOOT SURGERY  2006   [facility]:  silicone implants in both great toes    FOOT SURGERY Bilateral 2006   silicone implants both great toes   FRACTURE SURGERY  1967   pilonidal  1979   cyst excised    skin cancer on face removed  04/18/2023   skin cancer removed Left 2025   leg   STONE EXTRACTION WITH BASKET Right 09/29/2015   Procedure: STONE EXTRACTION WITH BASKET;  Surgeon: Rosina Riis, MD;  Location: [facility] ORS;  Service: Urology;  Laterality: Right;   TUBAL LIGATION  1990   Family History  Problem Relation Age of Onset   Aneurysm Mother 63       Hemorrhage CVA   Stroke Mother    Urolithiasis Mother    Kidney disease Mother    Birth defects Maternal Uncle    Cancer Neg Hx    Prostate cancer Neg Hx    Kidney cancer Neg Hx    Breast cancer Neg Hx    Social History   Occupational History   Not on file  Tobacco Use   Smoking status: Never   Smokeless tobacco: Never  Vaping Use   Vaping status: Never Used  Substance and Sexual Activity  Alcohol use: Yes    Alcohol/week: 3.0 standard drinks of alcohol    Types: 3 Glasses of wine per week    Comment: occasionally on weekends   Drug use: No   Sexual activity: Not Currently    Birth control/protection: Post-menopausal   Tobacco Counseling Counseling given: Not Answered  SDOH Screenings   Food Insecurity: No Food Insecurity (04/24/2024)  Housing: Low Risk  (04/24/2024)  Transportation Needs: No Transportation Needs (04/24/2024)  Utilities: Not At Risk (04/24/2024)  Alcohol Screen: Low Risk  (04/20/2024)  Depression (PHQ2-9): Low Risk  (04/24/2024)  Financial Resource Strain: Low Risk  (04/20/2024)  Physical Activity: Sufficiently Active (04/20/2024)  Social Connections:  Socially Integrated (04/20/2024)  Stress: No Stress Concern Present (04/20/2024)  Tobacco Use: Low Risk  (04/24/2024)  Health Literacy: Adequate Health Literacy (04/24/2024)   See flowsheets for full screening details  Depression Screen PHQ 2 & 9 Depression Scale- Over the past 2 weeks, how often have you been bothered by any of the following problems? Little interest or pleasure in doing things: 0 Feeling down, depressed, or hopeless (PHQ Adolescent also includes...irritable): 0 PHQ-2 Total Score: 0 Trouble falling or staying asleep, or sleeping too much: 0 Feeling tired or having little energy: 0 Poor appetite or overeating (PHQ Adolescent also includes...weight loss): 0 Feeling bad about yourself - or that you are a failure or have let yourself or your family down: 0 Trouble concentrating on things, such as reading the newspaper or watching television (PHQ Adolescent also includes...like school work): 0 Moving or speaking so slowly that other people could have noticed. Or the opposite - being so fidgety or restless that you have been moving around a lot more than usual: 0 Thoughts that you would be better off dead, or of hurting yourself in some way: 0 PHQ-9 Total Score: 0 If you checked off any problems, how difficult have these problems made it for you to do your work, take care of things at home, or get along with other people?: Not difficult at all     Goals Addressed             This Visit's Progress    Patient Stated       Wants to eat more fruits and vegetables             Objective:    Today's Vitals   04/24/24 1340  Weight: 97 lb (44 kg)  Height: 5' 0.6 (1.539 m)   Body mass index is 18.57 kg/m.  Hearing/Vision screen Hearing Screening - Comments:: No issues Vision Screening - Comments:: Glasses, [place] [facility], up to date Immunizations and Health Maintenance Health Maintenance  Topic Date Due   Influenza Vaccine  Never done   COVID-19 Vaccine (4 -  2025-26 season) 01/23/2024   Mammogram  08/08/2024   Medicare Annual Wellness (AWV)  04/24/2025   Colonoscopy  06/30/2031   DTaP/Tdap/Td (3 - Td or Tdap) 05/25/2033   Pneumococcal Vaccine: 50+ Years  Completed   Bone Density Scan  Completed   Hepatitis C Screening  Completed   Zoster Vaccines- Shingrix  Completed   Meningococcal B Vaccine  Aged Out        Assessment/Plan:  This is a routine wellness examination for Effa.  Patient Care Team: Marylynn Verneita CROME, MD as PCP - General (Internal Medicine) Unk Corinn Skiff, MD as Consulting Physician (Gastroenterology) Gregorio Adine MATSU, MD as Referring Physician (Dermatology)  I have personally reviewed and noted the following in the patient's chart:  Medical and social history Use of alcohol, tobacco or illicit drugs  Current medications and supplements including opioid prescriptions. Functional ability and status Nutritional status Physical activity Advanced directives List of other physicians Hospitalizations, surgeries, and ER visits in previous 12 months Vitals Screenings to include cognitive, depression, and falls Referrals and appointments  No orders of the defined types were placed in this encounter.  In addition, I have reviewed and discussed with patient certain preventive protocols, quality metrics, and best practice recommendations. A written personalized care plan for preventive services as well as general preventive health recommendations were provided to patient.   Angeline Fredericks, LPN   [id number]/[id number]   Return in 1 year (on 04/24/2025).  After Visit Summary: (MyChart) Due to this being a telephonic visit, the after visit summary with patients personalized plan was offered to patient via MyChart   Nurse Notes: Mammogram order placed. Patient declines flu and covid vaccines at this time.

## 2024-04-24 NOTE — Patient Instructions (Signed)
 Megan Mcintosh,  Thank you for taking the time for your Medicare Wellness Visit. I appreciate your continued commitment to your health goals. Please review the care plan we discussed, and feel free to reach out if I can assist you further.  Please note that Annual Wellness Visits do not include a physical exam. Some assessments may be limited, especially if the visit was conducted virtually. If needed, we may recommend an in-person follow-up with your provider.  Ongoing Care Seeing your primary care provider every 3 to 6 months helps us  monitor your health and provide consistent, personalized care.   You have an order for:  []   2D Mammogram  [x]   3D Mammogram  []   Bone Density     Please call for appointment:  [facility]  [address] [place] [address] [phone]    Make sure to wear two-piece clothing.  No lotions, powders, or deodorants the day of the appointment. Make sure to bring picture ID and insurance card.  Bring list of medications you are currently taking including any supplements.    Referrals If a referral was made during today's visit and you haven't received any updates within two weeks, please contact the referred provider directly to check on the status.  Recommended Screenings:  Health Maintenance  Topic Date Due   Flu Shot  Never done   COVID-19 Vaccine (4 - 2025-26 season) 01/23/2024   Breast Cancer Screening  08/08/2024   Medicare Annual Wellness Visit  04/24/2025   Colon Cancer Screening  06/30/2031   DTaP/Tdap/Td vaccine (3 - Td or Tdap) 05/25/2033   Pneumococcal Vaccine for age over 70  Completed   Osteoporosis screening with Bone Density Scan  Completed   Hepatitis C Screening  Completed   Zoster (Shingles) Vaccine  Completed   Meningitis B Vaccine  Aged Out       04/20/2024    8:55 AM  Advanced Directives  Does Patient Have a Medical Advance Directive? Yes  Type of Special Educational Needs Teacher of [place];Living will  Does patient want to make changes to medical advance directive? No - Patient declined  Copy of Healthcare Power of Attorney in Chart? No - copy requested    Vision: Annual vision screenings are recommended for early detection of glaucoma, cataracts, and diabetic retinopathy. These exams can also reveal signs of chronic conditions such as diabetes and high blood pressure.  Dental: Annual dental screenings help detect early signs of oral cancer, gum disease, and other conditions linked to overall health, including heart disease and diabetes.  Please see the attached documents for additional preventive care recommendations.

## 2024-05-10 ENCOUNTER — Encounter: Payer: Self-pay | Admitting: Internal Medicine

## 2024-05-10 ENCOUNTER — Ambulatory Visit (INDEPENDENT_AMBULATORY_CARE_PROVIDER_SITE_OTHER): Payer: Medicare Other | Admitting: Internal Medicine

## 2024-05-10 VITALS — BP 100/68 | HR 89 | Ht 60.5 in | Wt 94.8 lb

## 2024-05-10 DIAGNOSIS — E782 Mixed hyperlipidemia: Secondary | ICD-10-CM

## 2024-05-10 DIAGNOSIS — R5383 Other fatigue: Secondary | ICD-10-CM

## 2024-05-10 DIAGNOSIS — R7301 Impaired fasting glucose: Secondary | ICD-10-CM | POA: Diagnosis not present

## 2024-05-10 DIAGNOSIS — R799 Abnormal finding of blood chemistry, unspecified: Secondary | ICD-10-CM

## 2024-05-10 DIAGNOSIS — Z1231 Encounter for screening mammogram for malignant neoplasm of breast: Secondary | ICD-10-CM | POA: Diagnosis not present

## 2024-05-10 DIAGNOSIS — N200 Calculus of kidney: Secondary | ICD-10-CM

## 2024-05-10 DIAGNOSIS — E538 Deficiency of other specified B group vitamins: Secondary | ICD-10-CM

## 2024-05-10 DIAGNOSIS — M810 Age-related osteoporosis without current pathological fracture: Secondary | ICD-10-CM

## 2024-05-10 DIAGNOSIS — Z0000 Encounter for general adult medical examination without abnormal findings: Secondary | ICD-10-CM | POA: Diagnosis not present

## 2024-05-10 DIAGNOSIS — D751 Secondary polycythemia: Secondary | ICD-10-CM

## 2024-05-10 DIAGNOSIS — D582 Other hemoglobinopathies: Secondary | ICD-10-CM

## 2024-05-10 LAB — COMPREHENSIVE METABOLIC PANEL WITH GFR
ALT: 12 U/L (ref 3–35)
AST: 24 U/L (ref 5–37)
Albumin: 5.1 g/dL (ref 3.5–5.2)
Alkaline Phosphatase: 76 U/L (ref 39–117)
BUN: 13 mg/dL (ref 6–23)
CO2: 29 meq/L (ref 19–32)
Calcium: 10.1 mg/dL (ref 8.4–10.5)
Chloride: 101 meq/L (ref 96–112)
Creatinine, Ser: 0.79 mg/dL (ref 0.40–1.20)
GFR: 76.9 mL/min (ref >60.00)
Glucose, Bld: 79 mg/dL (ref 70–99)
Potassium: 4.6 meq/L (ref 3.5–5.1)
Sodium: 140 meq/L (ref 135–145)
Total Bilirubin: 0.7 mg/dL (ref 0.2–1.2)
Total Protein: 7.2 g/dL (ref 6.0–8.3)

## 2024-05-10 LAB — TSH: TSH: 2.21 u[IU]/mL (ref 0.35–5.50)

## 2024-05-10 LAB — LIPID PANEL
Cholesterol: 268 mg/dL — ABNORMAL HIGH (ref 28–200)
HDL: 135.4 mg/dL (ref >39.00)
LDL Cholesterol: 119 mg/dL — ABNORMAL HIGH (ref 10–99)
NonHDL: 132.26
Total CHOL/HDL Ratio: 2
Triglycerides: 65 mg/dL (ref 10.0–149.0)
VLDL: 13 mg/dL (ref 0.0–40.0)

## 2024-05-10 LAB — CBC WITH DIFFERENTIAL/PLATELET
Basophils Absolute: 0 K/uL (ref 0.0–0.1)
Basophils Relative: 0.8 % (ref 0.0–3.0)
Eosinophils Absolute: 0.1 K/uL (ref 0.0–0.7)
Eosinophils Relative: 1.2 % (ref 0.0–5.0)
HCT: 45.1 % (ref 36.0–46.0)
Hemoglobin: 15.1 g/dL — ABNORMAL HIGH (ref 12.0–15.0)
Lymphocytes Relative: 31.9 % (ref 12.0–46.0)
Lymphs Abs: 1.8 K/uL (ref 0.7–4.0)
MCHC: 33.6 g/dL (ref 30.0–36.0)
MCV: 90.7 fl (ref 78.0–100.0)
Monocytes Absolute: 0.4 K/uL (ref 0.1–1.0)
Monocytes Relative: 6.5 % (ref 3.0–12.0)
Neutro Abs: 3.3 K/uL (ref 1.4–7.7)
Neutrophils Relative %: 59.6 % (ref 43.0–77.0)
Platelets: 383 K/uL (ref 150.0–400.0)
RBC: 4.98 Mil/uL (ref 3.87–5.11)
RDW: 13.4 % (ref 11.5–15.5)
WBC: 5.6 K/uL (ref 4.0–10.5)

## 2024-05-10 LAB — LDL CHOLESTEROL, DIRECT: Direct LDL: 99 mg/dL

## 2024-05-10 LAB — HEMOGLOBIN A1C: Hgb A1c MFr Bld: 5.5 % (ref 4.6–6.5)

## 2024-05-10 NOTE — Patient Instructions (Signed)
 YOUR MAMMOGRAM has been ordered , PLEASE CALL AND GET THIS SCHEDULED! [facility] - call [phone]  Ver[facility] does  the scheduling for [facility] as well

## 2024-05-10 NOTE — Progress Notes (Unsigned)
 Patient ID: GENAVIE BOETTGER, female    DOB: Oct 20, 1955  Age: 68 y.o. MRN: [id number]  The patient is here for follow up and  management of  chronic and acute problems.   The risk factors are reflected in the social history.   The roster of all physicians providing medical care to patient - is listed in the Snapshot section of the chart.   Activities of daily living:  The patient is 100% independent in all ADLs: dressing, toileting, feeding as well as independent mobility   Home safety : The patient has smoke detectors in the home. They wear seatbelts.  There are no unsecured firearms at home. There is no violence in the home.    There is no risks for hepatitis, STDs or HIV. There is no   history of blood transfusion. They have no travel history to infectious disease endemic areas of the world.   The patient has seen their dentist in the last six month. They have seen their eye doctor in the last year. The patinet  denies slight hearing difficulty with regard to whispered voices and some television programs.  They have deferred audiologic testing in the last year.  They do not  have excessive sun exposure. Discussed the need for sun protection: hats, long sleeves and use of sunscreen if there is significant sun exposure.    Diet: the importance of a healthy diet is discussed. They do have a healthy diet.   The benefits of regular aerobic exercise were discussed. The patient  exercises  3 to 5 days per week  for  60 minutes.    Depression screen: there are no signs or vegative symptoms of depression- irritability, change in appetite, anhedonia, sadness/tearfullness.   The following portions of the patient's history were reviewed and updated as appropriate: allergies, current medications, past family history, past medical history,  past surgical history, past social history  and problem list.   Visual acuity was not assessed per patient preference since the patient has regular follow up with an   ophthalmologist. Hearing and body mass index were assessed and reviewed.    During the course of the visit the patient was educated and counseled about appropriate screening and preventive services including : fall prevention , diabetes screening, nutrition counseling, colorectal cancer screening, and recommended immunizations.    Chief Complaint:      Review of Symptoms  Patient denies headache, fevers, malaise, unintentional weight loss, skin rash, eye pain, sinus congestion and sinus pain, sore throat, dysphagia,  hemoptysis , cough, dyspnea, wheezing, chest pain, palpitations, orthopnea, edema, abdominal pain, nausea, melena, diarrhea, constipation, flank pain, dysuria, hematuria, urinary  Frequency, nocturia, numbness, tingling, seizures,  Focal weakness, Loss of consciousness,  Tremor, insomnia, depression, anxiety, and suicidal ideation.    Physical Exam:  BP 100/68   Pulse 89   Ht 5' 0.5 (1.537 m)   Wt 94 lb 12.8 oz (43 kg)   LMP 05/17/2011   SpO2 99%   BMI 18.21 kg/m    Physical Exam  Assessment and Plan: Impaired fasting glucose  Other fatigue  Mixed hyperlipidemia  Encounter for screening mammogram for malignant neoplasm of breast    No follow-ups on file.  Verneita LITTIE Kettering, MD

## 2024-05-12 ENCOUNTER — Ambulatory Visit: Payer: Self-pay | Admitting: Internal Medicine

## 2024-05-12 ENCOUNTER — Encounter: Payer: Self-pay | Admitting: Internal Medicine

## 2024-05-12 DIAGNOSIS — D582 Other hemoglobinopathies: Secondary | ICD-10-CM | POA: Insufficient documentation

## 2024-05-12 NOTE — Assessment & Plan Note (Signed)
 Likely due to hemoconcentration due to fasting state.  Repeat in 3 months

## 2024-05-12 NOTE — Assessment & Plan Note (Signed)
 BMD has improved. 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

## 2024-05-12 NOTE — Assessment & Plan Note (Signed)
 Supplemented.   Lab Results  Component Value Date   VITAMINB12 1,113 (H) 08/16/2023

## 2024-05-12 NOTE — Assessment & Plan Note (Signed)

## 2024-05-12 NOTE — Assessment & Plan Note (Signed)
 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 She has been avoiding recurrent episodes with increased focus on hydration

## 2024-08-16 ENCOUNTER — Other Ambulatory Visit: Payer: PRIVATE HEALTH INSURANCE

## 2025-04-29 ENCOUNTER — Ambulatory Visit: Payer: PRIVATE HEALTH INSURANCE
